# Patient Record
Sex: Male | Born: 1960
Health system: Southern US, Community
[De-identification: ages and names within clinical notes are randomized; demographics above are authoritative.]

## PROBLEM LIST (undated history)

## (undated) DIAGNOSIS — I499 Cardiac arrhythmia, unspecified: Secondary | ICD-10-CM

## (undated) DIAGNOSIS — M1990 Unspecified osteoarthritis, unspecified site: Secondary | ICD-10-CM

## (undated) HISTORY — PX: SHOULDER SURGERY: SHX246

## (undated) HISTORY — PX: VASECTOMY: SHX75

## (undated) HISTORY — PX: OTHER SURGICAL HISTORY: SHX169

---

## 2010-12-15 ENCOUNTER — Encounter: Payer: Self-pay | Admitting: Cardiology

## 2016-09-12 DIAGNOSIS — Z0000 Encounter for general adult medical examination without abnormal findings: Secondary | ICD-10-CM | POA: Diagnosis not present

## 2016-09-12 DIAGNOSIS — Z13220 Encounter for screening for lipoid disorders: Secondary | ICD-10-CM | POA: Diagnosis not present

## 2016-09-12 DIAGNOSIS — R002 Palpitations: Secondary | ICD-10-CM | POA: Diagnosis not present

## 2017-03-26 DIAGNOSIS — L821 Other seborrheic keratosis: Secondary | ICD-10-CM | POA: Diagnosis not present

## 2017-03-26 DIAGNOSIS — D225 Melanocytic nevi of trunk: Secondary | ICD-10-CM | POA: Diagnosis not present

## 2017-03-26 DIAGNOSIS — L814 Other melanin hyperpigmentation: Secondary | ICD-10-CM | POA: Diagnosis not present

## 2017-04-09 DIAGNOSIS — I669 Occlusion and stenosis of unspecified cerebral artery: Secondary | ICD-10-CM | POA: Diagnosis not present

## 2017-07-03 DIAGNOSIS — J018 Other acute sinusitis: Secondary | ICD-10-CM | POA: Diagnosis not present

## 2017-07-03 DIAGNOSIS — R05 Cough: Secondary | ICD-10-CM | POA: Diagnosis not present

## 2017-09-15 DIAGNOSIS — Z13220 Encounter for screening for lipoid disorders: Secondary | ICD-10-CM | POA: Diagnosis not present

## 2017-09-15 DIAGNOSIS — Z0000 Encounter for general adult medical examination without abnormal findings: Secondary | ICD-10-CM | POA: Diagnosis not present

## 2017-09-15 DIAGNOSIS — Z23 Encounter for immunization: Secondary | ICD-10-CM | POA: Diagnosis not present

## 2018-04-06 DIAGNOSIS — L578 Other skin changes due to chronic exposure to nonionizing radiation: Secondary | ICD-10-CM | POA: Diagnosis not present

## 2018-04-06 DIAGNOSIS — L821 Other seborrheic keratosis: Secondary | ICD-10-CM | POA: Diagnosis not present

## 2018-04-06 DIAGNOSIS — L218 Other seborrheic dermatitis: Secondary | ICD-10-CM | POA: Diagnosis not present

## 2018-05-12 DIAGNOSIS — K4020 Bilateral inguinal hernia, without obstruction or gangrene, not specified as recurrent: Secondary | ICD-10-CM | POA: Diagnosis not present

## 2018-05-12 DIAGNOSIS — Z23 Encounter for immunization: Secondary | ICD-10-CM | POA: Diagnosis not present

## 2018-05-13 ENCOUNTER — Other Ambulatory Visit: Payer: Self-pay | Admitting: Family Medicine

## 2018-05-13 ENCOUNTER — Other Ambulatory Visit: Payer: Self-pay

## 2018-05-13 DIAGNOSIS — K4020 Bilateral inguinal hernia, without obstruction or gangrene, not specified as recurrent: Secondary | ICD-10-CM

## 2018-05-14 ENCOUNTER — Ambulatory Visit
Admission: RE | Admit: 2018-05-14 | Discharge: 2018-05-14 | Disposition: A | Discharge status: Home / Self Care | Payer: 59 | Source: Ambulatory Visit | Attending: Family Medicine | Admitting: Family Medicine

## 2018-05-14 DIAGNOSIS — K4020 Bilateral inguinal hernia, without obstruction or gangrene, not specified as recurrent: Secondary | ICD-10-CM

## 2018-05-15 ENCOUNTER — Ambulatory Visit: Payer: Self-pay | Admitting: General Surgery

## 2018-05-19 NOTE — Patient Instructions (Addendum)
Andrew Matthews  05/19/2018   Your procedure is scheduled on: 06-02-18  Report to [facility] Main  Entrance  Report to admitting at      0730 AM    Call this number if you have problems the morning of surgery [phone]   Remember: Do not eat food:After Midnight.  NO SOLID FOOD AFTER MIDNIGHT THE NIGHT PRIOR TO SURGERY. NOTHING BY MOUTH EXCEPT CLEAR LIQUIDS UNTIL 3 HOURS PRIOR TO   [place] SURGERY. PLEASE FINISH ENSURE DRINK PER SURGEON ORDER 3 HOURS PRIOR TO SCHEDULED SURGERY TIME WHICH   NEEDS TO BE COMPLETED AT _0630 am ___________. Nothing by mouth after 0630 am    CLEAR LIQUID DIET   Foods Allowed                                                                     Foods Excluded  Coffee and tea, regular and decaf                             liquids that you cannot  Plain Jell-O in any flavor                                             see through such as: Fruit ices (not with fruit pulp)                                     milk, soups, orange juice  Iced Popsicles                                    All solid food Carbonated beverages, regular and diet                                    Cranberry, grape and apple juices Sports drinks like Gatorade Lightly seasoned clear broth or consume(fat free) Sugar, honey syrup   _____________________________________________________________________   BRUSH YOUR TEETH MORNING OF SURGERY AND RINSE YOUR MOUTH OUT, NO CHEWING GUM CANDY OR MINTS.     Take these medicines the morning of surgery with A SIP OF WATER: none                                You may not have any metal on your body including hair pins and              piercings  Do not wear jewelry, , lotions, powders or perfumes, deodorant                    Men may shave face and neck.   Do not bring valuables to the hospital. [facility] IS NOT  RESPONSIBLE   FOR VALUABLES.  Contacts, dentures or bridgework may not be worn into  surgery.  Leave suitcase in the car. After surgery it may be brought to your room.     Patients discharged the day of surgery will not be allowed to drive home.  Name and phone number of your driver:  Special Instructions: N/A              Please read over the following fact sheets you were given: _____________________________________________________________________             [facility] - Preparing for Surgery Before surgery, you can play an important role.  Because skin is not sterile, your skin needs to be as free of germs as possible.  You can reduce the number of germs on your skin by washing with CHG (chlorahexidine gluconate) soap before surgery.  CHG is an antiseptic cleaner which kills germs and bonds with the skin to continue killing germs even after washing. Please DO NOT use if you have an allergy to CHG or antibacterial soaps.  If your skin becomes reddened/irritated stop using the CHG and inform your nurse when you arrive at Short Stay. Do not shave (including legs and underarms) for at least 48 hours prior to the first CHG shower.  You may shave your face/neck. Please follow these instructions carefully:  1.  Shower with CHG Soap the night before surgery and the  morning of Surgery.  2.  If you choose to wash your hair, wash your hair first as usual with your  normal  shampoo.  3.  After you shampoo, rinse your hair and body thoroughly to remove the  shampoo.                           4.  Use CHG as you would any other liquid soap.  You can apply chg directly  to the skin and wash                       Gently with a scrungie or clean washcloth.  5.  Apply the CHG Soap to your body ONLY FROM THE NECK DOWN.   Do not use on face/ open                           Wound or open sores. Avoid contact with eyes, ears mouth and genitals (private parts).                       Wash face,  Genitals (private parts) with your normal soap.             6.  Wash thoroughly, paying special  attention to the area where your surgery  will be performed.  7.  Thoroughly rinse your body with warm water from the neck down.  8.  DO NOT shower/wash with your normal soap after using and rinsing off  the CHG Soap.                9.  Pat yourself dry with a clean towel.            10.  Wear clean pajamas.            11.  Place clean sheets on your bed the night of your first shower and do not  sleep with pets. Day of Surgery :  Do not apply any lotions/deodorants the morning of surgery.  Please wear clean clothes to the hospital/surgery center.  FAILURE TO FOLLOW THESE INSTRUCTIONS MAY RESULT IN THE CANCELLATION OF YOUR SURGERY PATIENT SIGNATURE_________________________________  NURSE SIGNATURE__________________________________  ________________________________________________________________________

## 2018-05-20 ENCOUNTER — Encounter (HOSPITAL_COMMUNITY): Payer: Self-pay

## 2018-05-20 ENCOUNTER — Encounter (HOSPITAL_COMMUNITY)
Admission: RE | Admit: 2018-05-20 | Discharge: 2018-05-20 | Disposition: A | Discharge status: Home / Self Care | Payer: 59 | Source: Ambulatory Visit | Attending: General Surgery | Admitting: General Surgery

## 2018-05-20 ENCOUNTER — Other Ambulatory Visit: Payer: Self-pay

## 2018-05-20 DIAGNOSIS — R001 Bradycardia, unspecified: Secondary | ICD-10-CM | POA: Diagnosis not present

## 2018-05-20 DIAGNOSIS — Z01818 Encounter for other preprocedural examination: Secondary | ICD-10-CM | POA: Insufficient documentation

## 2018-05-20 DIAGNOSIS — K4020 Bilateral inguinal hernia, without obstruction or gangrene, not specified as recurrent: Secondary | ICD-10-CM | POA: Diagnosis not present

## 2018-05-20 HISTORY — DX: Cardiac arrhythmia, unspecified: I49.9

## 2018-05-20 HISTORY — DX: Unspecified osteoarthritis, unspecified site: M19.90

## 2018-05-20 LAB — CBC
HEMATOCRIT: 45.6 % (ref 39.0–52.0)
HEMOGLOBIN: 15.8 g/dL (ref 13.0–17.0)
MCH: 32.8 pg (ref 26.0–34.0)
MCHC: 34.6 g/dL (ref 30.0–36.0)
MCV: 94.6 fL (ref 78.0–100.0)
Platelets: 165 10*3/uL (ref 150–400)
RBC: 4.82 MIL/uL (ref 4.22–5.81)
RDW: 12.1 % (ref 11.5–15.5)
WBC: 3.3 10*3/uL — AB (ref 4.0–10.5)

## 2018-06-02 ENCOUNTER — Other Ambulatory Visit: Payer: Self-pay

## 2018-06-02 ENCOUNTER — Ambulatory Visit (HOSPITAL_COMMUNITY): Payer: 59 | Admitting: Anesthesiology

## 2018-06-02 ENCOUNTER — Encounter (HOSPITAL_COMMUNITY)
Admission: RE | Disposition: A | Discharge status: Home Health | Payer: Self-pay | Source: Ambulatory Visit | Attending: General Surgery

## 2018-06-02 ENCOUNTER — Ambulatory Visit (HOSPITAL_COMMUNITY)
Admission: RE | Admit: 2018-06-02 | Discharge: 2018-06-02 | Disposition: A | Discharge status: Home Health | Payer: 59 | Source: Ambulatory Visit | Attending: General Surgery | Admitting: General Surgery

## 2018-06-02 ENCOUNTER — Encounter (HOSPITAL_COMMUNITY): Payer: Self-pay | Admitting: *Deleted

## 2018-06-02 DIAGNOSIS — K4020 Bilateral inguinal hernia, without obstruction or gangrene, not specified as recurrent: Secondary | ICD-10-CM | POA: Diagnosis not present

## 2018-06-02 DIAGNOSIS — Z791 Long term (current) use of non-steroidal anti-inflammatories (NSAID): Secondary | ICD-10-CM | POA: Insufficient documentation

## 2018-06-02 DIAGNOSIS — Z9852 Vasectomy status: Secondary | ICD-10-CM | POA: Insufficient documentation

## 2018-06-02 DIAGNOSIS — Z880 Allergy status to penicillin: Secondary | ICD-10-CM | POA: Insufficient documentation

## 2018-06-02 DIAGNOSIS — M479 Spondylosis, unspecified: Secondary | ICD-10-CM | POA: Insufficient documentation

## 2018-06-02 DIAGNOSIS — D176 Benign lipomatous neoplasm of spermatic cord: Secondary | ICD-10-CM | POA: Diagnosis not present

## 2018-06-02 HISTORY — PX: INSERTION OF MESH: SHX5868

## 2018-06-02 HISTORY — PX: INGUINAL HERNIA REPAIR: SHX194

## 2018-06-02 SURGERY — REPAIR, HERNIA, INGUINAL, BILATERAL, LAPAROSCOPIC
Anesthesia: General | Laterality: Bilateral

## 2018-06-02 MED ORDER — GABAPENTIN 300 MG PO CAPS
300.0000 mg | ORAL_CAPSULE | ORAL | Status: AC
  Administered 2018-06-02: 300 mg via ORAL
  Filled 2018-06-02: qty 1

## 2018-06-02 MED ORDER — PROPOFOL 10 MG/ML IV BOLUS
INTRAVENOUS | Status: AC
  Filled 2018-06-02: qty 20

## 2018-06-02 MED ORDER — FENTANYL CITRATE (PF) 100 MCG/2ML IJ SOLN: 25.0000 ug | INTRAMUSCULAR | Status: DC | PRN

## 2018-06-02 MED ORDER — SUGAMMADEX SODIUM 200 MG/2ML IV SOLN
INTRAVENOUS | Status: DC | PRN
  Administered 2018-06-02: 200 mg via INTRAVENOUS

## 2018-06-02 MED ORDER — CEFAZOLIN SODIUM-DEXTROSE 2-4 GM/100ML-% IV SOLN
2.0000 g | INTRAVENOUS | Status: AC
  Administered 2018-06-02: 2 g via INTRAVENOUS
  Filled 2018-06-02: qty 100

## 2018-06-02 MED ORDER — DEXAMETHASONE SODIUM PHOSPHATE 10 MG/ML IJ SOLN
INTRAMUSCULAR | Status: DC | PRN
  Administered 2018-06-02: 10 mg via INTRAVENOUS

## 2018-06-02 MED ORDER — ROCURONIUM BROMIDE 10 MG/ML (PF) SYRINGE
PREFILLED_SYRINGE | INTRAVENOUS | Status: DC | PRN
  Administered 2018-06-02: 20 mg via INTRAVENOUS
  Administered 2018-06-02 (×3): 10 mg via INTRAVENOUS
  Administered 2018-06-02: 40 mg via INTRAVENOUS

## 2018-06-02 MED ORDER — MIDAZOLAM HCL 2 MG/2ML IJ SOLN
INTRAMUSCULAR | Status: AC
  Filled 2018-06-02: qty 2

## 2018-06-02 MED ORDER — ONDANSETRON HCL 4 MG/2ML IJ SOLN
INTRAMUSCULAR | Status: DC | PRN
  Administered 2018-06-02: 4 mg via INTRAVENOUS

## 2018-06-02 MED ORDER — IBUPROFEN 800 MG PO TABS: 800.0000 mg | ORAL_TABLET | Freq: Three times a day (TID) | ORAL | 0 refills | Status: DC | PRN

## 2018-06-02 MED ORDER — ACETAMINOPHEN 500 MG PO TABS
1000.0000 mg | ORAL_TABLET | ORAL | Status: AC
  Administered 2018-06-02: 1000 mg via ORAL
  Filled 2018-06-02: qty 2

## 2018-06-02 MED ORDER — ENSURE PRE-SURGERY PO LIQD: 296.0000 mL | Freq: Once | ORAL | Status: DC

## 2018-06-02 MED ORDER — LACTATED RINGERS IV SOLN
INTRAVENOUS | Status: DC
  Administered 2018-06-02 (×2): via INTRAVENOUS

## 2018-06-02 MED ORDER — ONDANSETRON HCL 4 MG/2ML IJ SOLN
INTRAMUSCULAR | Status: AC
  Filled 2018-06-02: qty 2

## 2018-06-02 MED ORDER — 0.9 % SODIUM CHLORIDE (POUR BTL) OPTIME
TOPICAL | Status: DC | PRN
  Administered 2018-06-02: 1000 mL

## 2018-06-02 MED ORDER — LIDOCAINE 2% (20 MG/ML) 5 ML SYRINGE
INTRAMUSCULAR | Status: AC
  Filled 2018-06-02: qty 5

## 2018-06-02 MED ORDER — BUPIVACAINE LIPOSOME 1.3 % IJ SUSP
20.0000 mL | Freq: Once | INTRAMUSCULAR | Status: AC
  Administered 2018-06-02: 20 mL
  Filled 2018-06-02: qty 20

## 2018-06-02 MED ORDER — BUPIVACAINE HCL (PF) 0.25 % IJ SOLN
INTRAMUSCULAR | Status: AC
  Filled 2018-06-02: qty 30

## 2018-06-02 MED ORDER — FENTANYL CITRATE (PF) 100 MCG/2ML IJ SOLN
INTRAMUSCULAR | Status: DC | PRN
  Administered 2018-06-02: 50 ug via INTRAVENOUS
  Administered 2018-06-02: 100 ug via INTRAVENOUS

## 2018-06-02 MED ORDER — LIDOCAINE 2% (20 MG/ML) 5 ML SYRINGE
INTRAMUSCULAR | Status: DC | PRN
  Administered 2018-06-02 (×2): 1.5 mg/kg/h via INTRAVENOUS

## 2018-06-02 MED ORDER — CELECOXIB 200 MG PO CAPS
400.0000 mg | ORAL_CAPSULE | ORAL | Status: AC
  Administered 2018-06-02: 400 mg via ORAL
  Filled 2018-06-02: qty 2

## 2018-06-02 MED ORDER — HYDROCODONE-ACETAMINOPHEN 5-325 MG PO TABS: 1.0000 | ORAL_TABLET | Freq: Four times a day (QID) | ORAL | 0 refills | Status: DC | PRN

## 2018-06-02 MED ORDER — CHLORHEXIDINE GLUCONATE CLOTH 2 % EX PADS: 6.0000 | MEDICATED_PAD | Freq: Once | CUTANEOUS | Status: DC

## 2018-06-02 MED ORDER — BUPIVACAINE HCL (PF) 0.25 % IJ SOLN
INTRAMUSCULAR | Status: DC | PRN
  Administered 2018-06-02: 30 mL

## 2018-06-02 MED ORDER — OXYCODONE HCL 5 MG PO TABS
5.0000 mg | ORAL_TABLET | Freq: Once | ORAL | Status: AC | PRN
  Administered 2018-06-02: 5 mg via ORAL

## 2018-06-02 MED ORDER — MIDAZOLAM HCL 5 MG/5ML IJ SOLN
INTRAMUSCULAR | Status: DC | PRN
  Administered 2018-06-02: 2 mg via INTRAVENOUS

## 2018-06-02 MED ORDER — PROPOFOL 10 MG/ML IV BOLUS
INTRAVENOUS | Status: DC | PRN
  Administered 2018-06-02: 150 mg via INTRAVENOUS

## 2018-06-02 MED ORDER — OXYCODONE HCL 5 MG PO TABS
ORAL_TABLET | ORAL | Status: AC
  Filled 2018-06-02: qty 1

## 2018-06-02 MED ORDER — ONDANSETRON HCL 4 MG/2ML IJ SOLN: 4.0000 mg | Freq: Once | INTRAMUSCULAR | Status: DC | PRN

## 2018-06-02 MED ORDER — OXYCODONE HCL 5 MG/5ML PO SOLN
5.0000 mg | Freq: Once | ORAL | Status: AC | PRN
  Filled 2018-06-02: qty 5

## 2018-06-02 MED ORDER — FENTANYL CITRATE (PF) 250 MCG/5ML IJ SOLN
INTRAMUSCULAR | Status: AC
  Filled 2018-06-02: qty 5

## 2018-06-02 MED ORDER — SUGAMMADEX SODIUM 200 MG/2ML IV SOLN
INTRAVENOUS | Status: AC
  Filled 2018-06-02: qty 2

## 2018-06-02 SURGICAL SUPPLY — 35 items
APPLIER CLIP 5 13 M/L LIGAMAX5 (MISCELLANEOUS)
BANDAGE ADH SHEER 1  50/CT (GAUZE/BANDAGES/DRESSINGS) ×3 IMPLANT
BENZOIN TINCTURE PRP APPL 2/3 (GAUZE/BANDAGES/DRESSINGS) ×3 IMPLANT
CABLE HIGH FREQUENCY MONO STRZ (ELECTRODE) ×3 IMPLANT
CHLORAPREP W/TINT 26ML (MISCELLANEOUS) ×3 IMPLANT
CLIP APPLIE 5 13 M/L LIGAMAX5 (MISCELLANEOUS) IMPLANT
CLOSURE WOUND 1/2 X4 (GAUZE/BANDAGES/DRESSINGS) ×1
COVER SURGICAL LIGHT HANDLE (MISCELLANEOUS) ×3 IMPLANT
COVER WAND RF STERILE (DRAPES) IMPLANT
DECANTER SPIKE VIAL GLASS SM (MISCELLANEOUS) ×3 IMPLANT
DEVICE SECURE STRAP 25 ABSORB (INSTRUMENTS) ×3 IMPLANT
DRAIN CHANNEL 19F RND (DRAIN) IMPLANT
ELECT REM PT RETURN 15FT ADLT (MISCELLANEOUS) ×3 IMPLANT
EVACUATOR SILICONE 100CC (DRAIN) IMPLANT
GLOVE BIO SURGEON STRL SZ7 (GLOVE) ×3 IMPLANT
GLOVE BIOGEL PI IND STRL 7.0 (GLOVE) ×1 IMPLANT
GLOVE BIOGEL PI INDICATOR 7.0 (GLOVE) ×2
GLOVE SURG SS PI 7.0 STRL IVOR (GLOVE) ×3 IMPLANT
GOWN STRL REUS W/TWL LRG LVL3 (GOWN DISPOSABLE) ×3 IMPLANT
GOWN STRL REUS W/TWL XL LVL3 (GOWN DISPOSABLE) ×6 IMPLANT
KIT BASIN OR (CUSTOM PROCEDURE TRAY) ×3 IMPLANT
MESH 3DMAX 4X6 LT LRG (Mesh General) ×3 IMPLANT
MESH 3DMAX 5X7 RT XLRG (Mesh General) ×3 IMPLANT
SCISSORS LAP 5X35 DISP (ENDOMECHANICALS) IMPLANT
SET IRRIG TUBING LAPAROSCOPIC (IRRIGATION / IRRIGATOR) ×3 IMPLANT
STRIP CLOSURE SKIN 1/2X4 (GAUZE/BANDAGES/DRESSINGS) ×2 IMPLANT
SUT ETHILON 2 0 PS N (SUTURE) IMPLANT
SUT MNCRL AB 4-0 PS2 18 (SUTURE) ×3 IMPLANT
SUT VICRYL 0 UR6 27IN ABS (SUTURE) IMPLANT
TOWEL OR 17X26 10 PK STRL BLUE (TOWEL DISPOSABLE) ×3 IMPLANT
TRAY FOLEY MTR SLVR 16FR STAT (SET/KITS/TRAYS/PACK) IMPLANT
TRAY LAPAROSCOPIC (CUSTOM PROCEDURE TRAY) ×3 IMPLANT
TROCAR CANNULA W/PORT DUAL 5MM (MISCELLANEOUS) ×3 IMPLANT
TROCAR XCEL 12X100 BLDLESS (ENDOMECHANICALS) ×3 IMPLANT
TUBING INSUF HEATED (TUBING) ×3 IMPLANT

## 2018-06-02 NOTE — Op Note (Signed)
Preop diagnosis: bilateral inguinal hernia  Postop diagnosis: bilateral inguinal hernia  Procedure: laparoscopic Bilateral inguinal hernia repair with mesh  Surgeon: Gurney Maxin, M.D.  Asst: none  Anesthesia: Gen.   Indications for procedure: Andrew Matthews is a 57 y.o. male with symptoms of pain and enlarging Bilateral inguinal hernia(s). After discussing risks, alternatives and benefits he decided on laparoscopic repair and was brought to day surgery for repair.  Description of procedure: The patient was brought into the operative suite, placed supine. Anesthesia was administered with endotracheal tube. Patient was strapped in place. The patient was prepped and draped in the usual sterile fashion.  Next Exparel:Marcaine mix was injected to the left of the umbilicus, and a transverse 2 cm incision was made. Dissection was used to visualize the anterior rectus sheath. Anterior rectus sheath was sharply incised, next to the 12 mm trocar was inserted into the preperitoneal space. Laparoscope was inserted to confirm placement and then used to bluntly dissect the retrorectus space clear. Once the midline ws free 2 65mm trocars were placed. 1 in the suprapubic area and 1in 5cm superior to the first.  On initial visualization , There appeared to be a small direct right inguinal hernia. Next a began our dissection identifying the ASIS laterally and then working back medially removing the filmy tissue and adhesions of the peritoneum to the abdominal wall. Hernia sac was completely dissected out of the canal. Vas deference and contents of the cord were safely dissected away of the hernia sac. The Exparel mix was infused along the transversus abdominis planes laterally and near the lacunar ligament medially. There was a lipoma along the cord that was reduced from the inguinal canal.  Next, attention was turned to the left side. There was a small left direct inguinal hernia that was reduced. Next a began  our dissection identifying the ASIS laterally and then working back medially removing the filmy tissue and adhesions of the peritoneum to the abdominal wall. Hernia sac was completely dissected out of the canal. Vas deference and contents of the cord were safely dissected away of the hernia sac. The Exparel mix was infused along the transversus abdominis planes laterally and near the lacunar ligament medially. There was a lipoma along the cord that was reduced from the inguinal canal. There was one area of bleeding on the abdominal wall lateral to the deep inguinal ring, hemostasis was applied with cautery.  A left large 3D max mesh was inserted and tacked medially to the lacunar ligament. There were 2 additional tacks used in the anterior space. The mesh was positioned flat and directly up against the direct and indirect areas. A right extra large 3D max mesh was inserted and tacked medially to the lacunar ligament. There were 2 additional tacks used in the anterior space. The mesh was positioned flat and directly up against the direct and indirect areas  The CO2 was evacuated while watching to ensure the mesh did not migrate.. The anterior rectus fascia was closed with 0 vicryl in interrupted sutures and all skin incisions were closed with 4-0 monocryl subcu stitch. The patient awoke from anesthesia and was brought to PACU in stable condition.  Findings: bilateral direct inguinal hernias, bilateral lipoma of the cord  Specimen: none  Blood loss: 30 ml  Local anesthesia: 50 ml Exparel:Marcaine mix  Complications: none  Implant: large left Bard 3D max mesh, extra large right Bard 3D max mesh  Images:    Gurney Maxin, M.D. General, Bariatric, & Minimally Invasive  [facility], [place] 10:55 AM 06/02/2018

## 2018-06-02 NOTE — Anesthesia Preprocedure Evaluation (Addendum)
Anesthesia Evaluation  Patient identified by MRN, date of birth, ID band Patient awake    Reviewed: Allergy & Precautions, NPO status , Patient's Chart, lab work & pertinent test results  History of Anesthesia Complications Negative for: history of anesthetic complications  Airway Mallampati: I  TM Distance: <3 FB Neck ROM: Full    Dental no notable dental hx. (+) Teeth Intact, Dental Advisory Given   Pulmonary neg pulmonary ROS,    Pulmonary exam normal        Cardiovascular Exercise Tolerance: Good Normal cardiovascular exam+ dysrhythmias (SVT associated with exercise; no meds)      Neuro/Psych negative neurological ROS  negative psych ROS   GI/Hepatic negative GI ROS, Neg liver ROS,   Endo/Other  negative endocrine ROS  Renal/GU negative Renal ROS  negative genitourinary   Musculoskeletal  (+) Arthritis , Osteoarthritis,    Abdominal   Peds  Hematology negative hematology ROS (+)   Anesthesia Other Findings   Reproductive/Obstetrics                            Anesthesia Physical Anesthesia Plan  ASA: II  Anesthesia Plan: General   Post-op Pain Management:    Induction: Intravenous  PONV Risk Score and Plan: 2 and Ondansetron, Dexamethasone and Treatment may vary due to age or medical condition  Airway Management Planned: Oral ETT  Additional Equipment: None  Intra-op Plan:   Post-operative Plan: Extubation in OR  Informed Consent: I have reviewed the patients History and Physical, chart, labs and discussed the procedure including the risks, benefits and alternatives for the proposed anesthesia with the patient or authorized representative who has indicated his/her understanding and acceptance.     Plan Discussed with:   Anesthesia Plan Comments:        Anesthesia Quick Evaluation

## 2018-06-02 NOTE — Transfer of Care (Signed)
Immediate Anesthesia Transfer of Care Note  Patient: Andrew Matthews  Procedure(s) Performed: LAPAROSCOPIC BILATERAL INGUINAL HERNIA REPAIR (Bilateral ) INSERTION OF MESH (Bilateral )  Patient Location: PACU  Anesthesia Type:General  Level of Consciousness: awake, alert  and oriented  Airway & Oxygen Therapy: Patient Spontanous Breathing and Patient connected to face mask oxygen  Post-op Assessment: Report given to RN and Post -op Vital signs reviewed and stable  Post vital signs: Reviewed and stable  Last Vitals:  Vitals Value Taken Time  BP 136/88 06/02/2018 11:03 AM  Temp    Pulse 70 06/02/2018 11:05 AM  Resp 17 06/02/2018 11:05 AM  SpO2 98 % 06/02/2018 11:05 AM  Vitals shown include unvalidated device data.  Last Pain:  Vitals:   06/02/18 0744  TempSrc: Oral      Patients Stated Pain Goal: 3 (47/15/95 [id number])  Complications: No apparent anesthesia complications

## 2018-06-02 NOTE — Anesthesia Postprocedure Evaluation (Signed)
Anesthesia Post Note  Patient: Andrew Matthews  Procedure(s) Performed: LAPAROSCOPIC BILATERAL INGUINAL HERNIA REPAIR (Bilateral ) INSERTION OF MESH (Bilateral )     Patient location during evaluation: PACU Anesthesia Type: General Level of consciousness: awake and alert Pain management: pain level controlled Vital Signs Assessment: post-procedure vital signs reviewed and stable Respiratory status: spontaneous breathing, nonlabored ventilation and respiratory function stable Cardiovascular status: blood pressure returned to baseline and stable Postop Assessment: no apparent nausea or vomiting Anesthetic complications: no    Last Vitals:  Vitals:   06/02/18 1115 06/02/18 1130  BP: 140/90 (!) 135/93  Pulse: (!) 59 (!) 57  Resp: 16 13  Temp:    SpO2: 100% 98%    Last Pain:  Vitals:   06/02/18 1130  TempSrc:   PainSc: 0-No pain                 Lidia Collum

## 2018-06-02 NOTE — H&P (Signed)
Andrew Matthews is an 57 y.o. male.   Chief Complaint: abdominal pain HPI: 57 yo male with lower abdominal pain found to have bilateral hernias.  Past Medical History:  Diagnosis Date  . Arthritis    osteoarthritis lower back  . Dysrhythmia    Arrythmias  with workouts See's Dr. Wynonia Lawman    Past Surgical History:  Procedure Laterality Date  . hemmhroiectomy    . SHOULDER SURGERY     manipulation under anesthesia  . VASECTOMY      History reviewed. No pertinent family history. Social History:  reports that Andrew Matthews has never smoked. Andrew Matthews has never used smokeless tobacco. Andrew Matthews reports that Andrew Matthews drinks about 1.0 standard drinks of alcohol per week. Andrew Matthews reports that Andrew Matthews does not use drugs.  Allergies:  Allergies  Allergen Reactions  . Penicillins Other (See Comments)    Makes blood pressure drop    Medications Prior to Admission  Medication Sig Dispense Refill  . meloxicam (MOBIC) 7.5 MG tablet Take 7.5 mg by mouth daily as needed for pain.  2  . naproxen sodium (ALEVE) 220 MG tablet Take 220 mg by mouth daily as needed (pain).      No results found for this or any previous visit (from the past 48 hour(s)). No results found.  Review of Systems  Constitutional: Negative for chills and fever.  HENT: Negative for hearing loss.   Eyes: Negative for blurred vision and double vision.  Respiratory: Negative for cough and hemoptysis.   Cardiovascular: Negative for chest pain and palpitations.  Gastrointestinal: Positive for abdominal pain. Negative for nausea and vomiting.  Genitourinary: Negative for dysuria and urgency.  Musculoskeletal: Negative for myalgias and neck pain.  Skin: Negative for itching and rash.  Neurological: Negative for dizziness, tingling and headaches.  Endo/Heme/Allergies: Does not bruise/bleed easily.  Psychiatric/Behavioral: Negative for depression and suicidal ideas.    Blood pressure 126/79, pulse (!) 52, temperature 97.8 F (36.6 C), temperature source Oral,  resp. rate 16, height 6' (1.829 m), weight 83.9 kg, SpO2 100 %. Physical Exam  Vitals reviewed. Constitutional: Andrew Matthews is oriented to person, place, and time. Andrew Matthews appears well-developed and well-nourished.  HENT:  Head: Normocephalic and atraumatic.  Eyes: Pupils are equal, round, and reactive to light. Conjunctivae and EOM are normal.  Neck: Normal range of motion. Neck supple.  Cardiovascular: Normal rate and regular rhythm.  Respiratory: Effort normal and breath sounds normal.  GI: Soft. Bowel sounds are normal. Andrew Matthews exhibits no distension. There is no tenderness.  Bilateral inguinal hernias  Musculoskeletal: Normal range of motion.  Neurological: Andrew Matthews is alert and oriented to person, place, and time.  Skin: Skin is warm and dry.  Psychiatric: Andrew Matthews has a normal mood and affect. His behavior is normal.     Assessment/Plan 57 yo male with bilateral inguinal hernias -lap bilateral inguinal hernia repair with mesh -planned outpatient procedure  Mickeal Skinner, MD 06/02/2018, 9:17 AM

## 2018-06-02 NOTE — Anesthesia Procedure Notes (Signed)
Procedure Name: Intubation Date/Time: 06/02/2018 9:40 AM Performed by: Glory Buff, CRNA Pre-anesthesia Checklist: Patient identified, Emergency Drugs available, Suction available and Patient being monitored Patient Re-evaluated:Patient Re-evaluated prior to induction Oxygen Delivery Method: Circle system utilized Preoxygenation: Pre-oxygenation with 100% oxygen Induction Type: IV induction Ventilation: Mask ventilation without difficulty Laryngoscope Size: Miller and 3 Grade View: Grade I Tube type: Oral Tube size: 7.5 mm Number of attempts: 1 Airway Equipment and Method: Stylet and Oral airway Placement Confirmation: ETT inserted through vocal cords under direct vision,  positive ETCO2 and breath sounds checked- equal and bilateral Secured at: 22 cm Tube secured with: Tape Dental Injury: Teeth and Oropharynx as per pre-operative assessment

## 2018-06-03 ENCOUNTER — Encounter (HOSPITAL_COMMUNITY): Payer: Self-pay | Admitting: General Surgery

## 2018-09-22 DIAGNOSIS — L820 Inflamed seborrheic keratosis: Secondary | ICD-10-CM | POA: Diagnosis not present

## 2018-09-29 DIAGNOSIS — Z136 Encounter for screening for cardiovascular disorders: Secondary | ICD-10-CM | POA: Diagnosis not present

## 2018-09-29 DIAGNOSIS — Z5181 Encounter for therapeutic drug level monitoring: Secondary | ICD-10-CM | POA: Diagnosis not present

## 2018-09-29 DIAGNOSIS — R7309 Other abnormal glucose: Secondary | ICD-10-CM | POA: Diagnosis not present

## 2018-10-01 DIAGNOSIS — Z0000 Encounter for general adult medical examination without abnormal findings: Secondary | ICD-10-CM | POA: Diagnosis not present

## 2019-04-29 IMAGING — US US PELVIS LIMITED
1 series · 10 of 10 positions shown · non-contrast
Comparison: None.

CLINICAL DATA: Assess bilateral inguinal hernias

EXAM:
LIMITED ULTRASOUND OF PELVIS
TECHNIQUE: Limited transabdominal ultrasound examination of the pelvis was
performed.

[Series 1: us pelvis limited · 10 acquisitions, 10 frames shown]
[im 1/10]
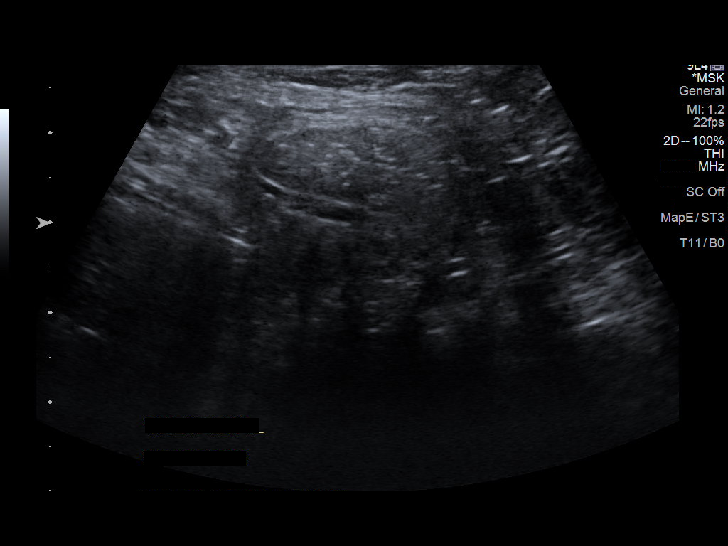
[im 2/10]
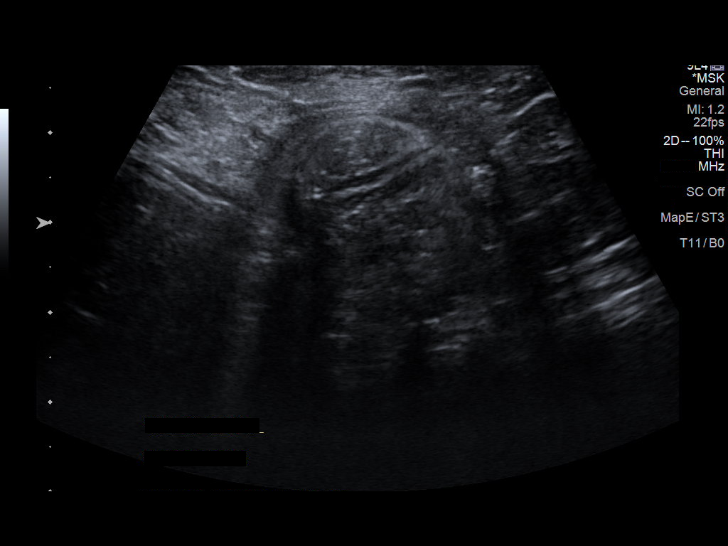
[im 3/10]
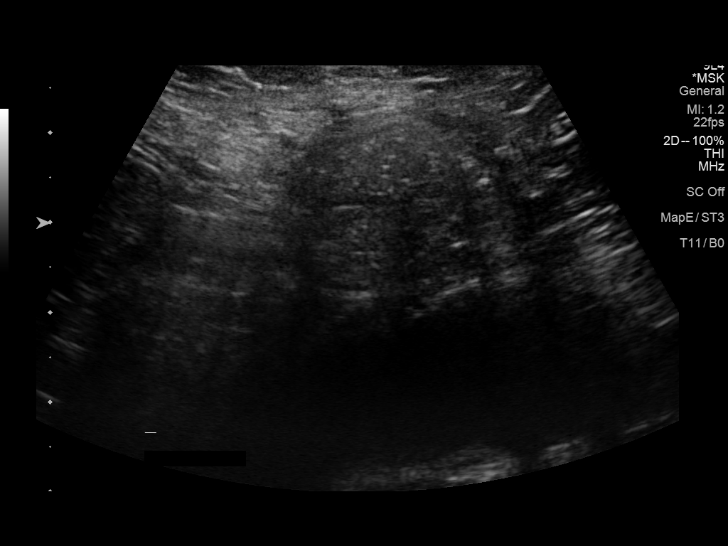
[im 4/10]
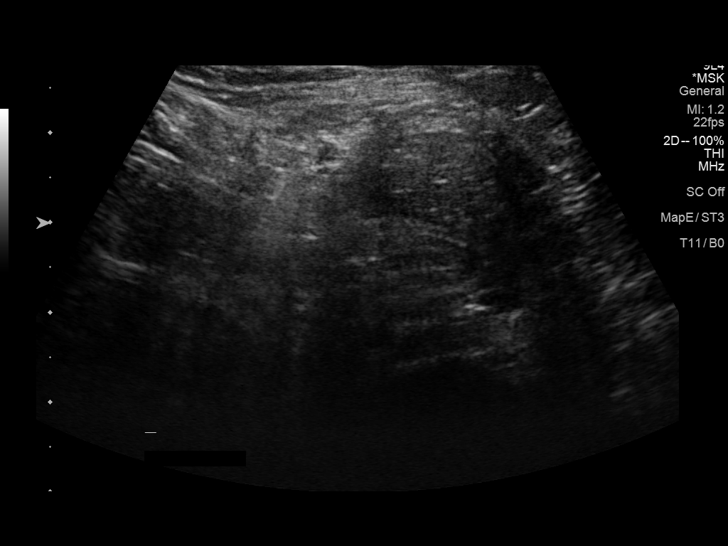
[im 5/10]
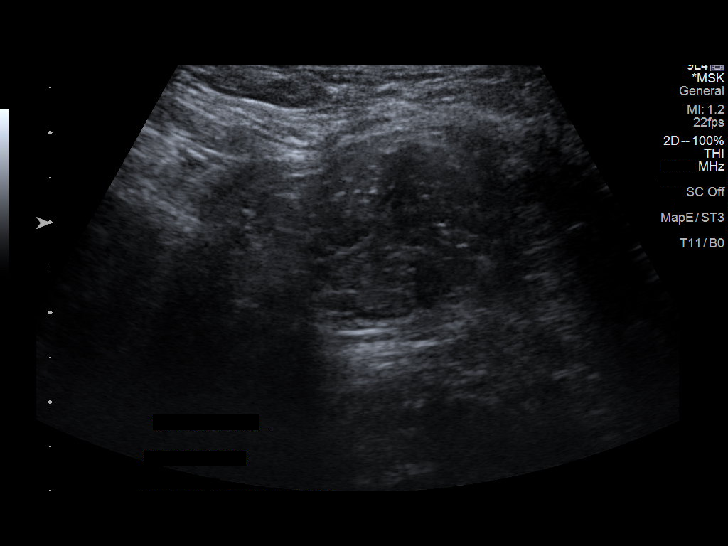
[im 6/10]
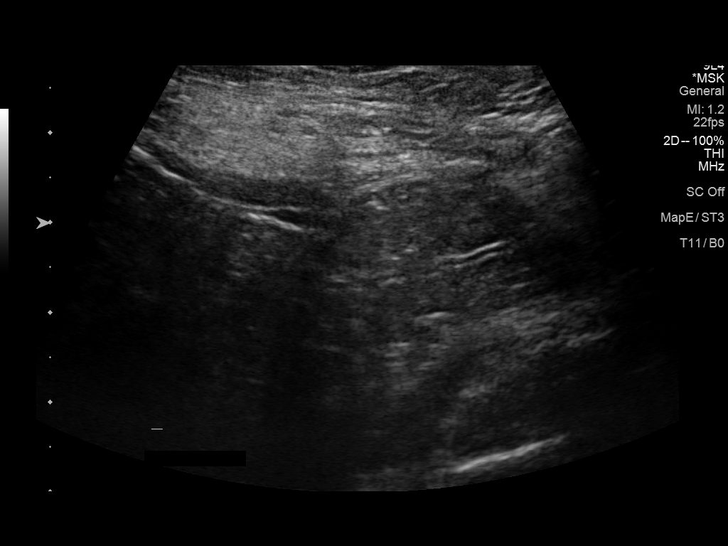
[im 7/10]
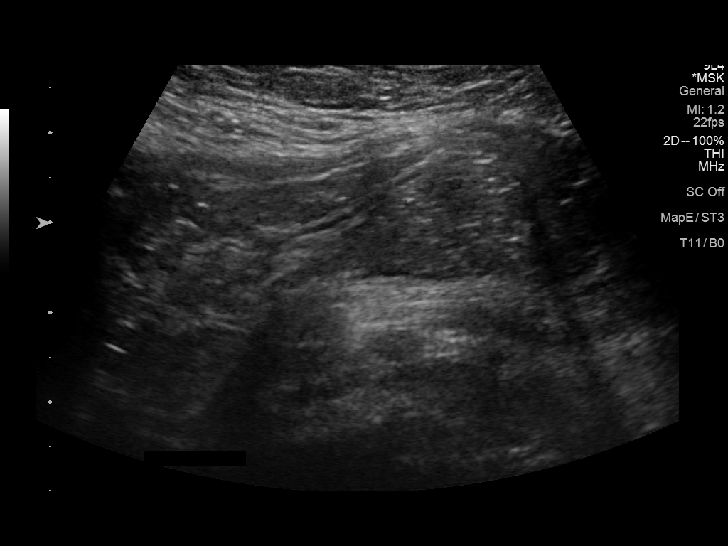
[im 8/10]
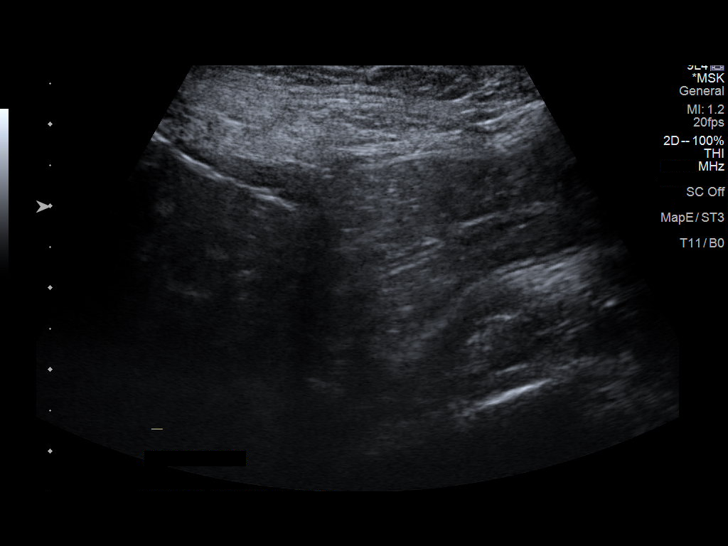
[im 9/10]
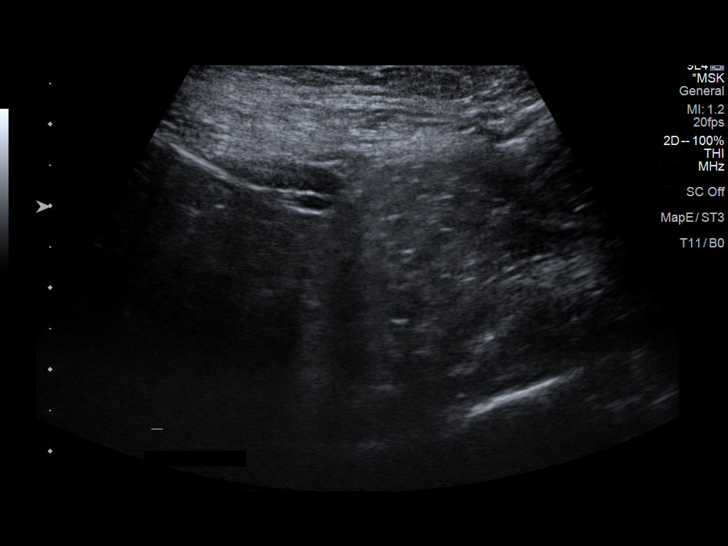
[im 10/10]
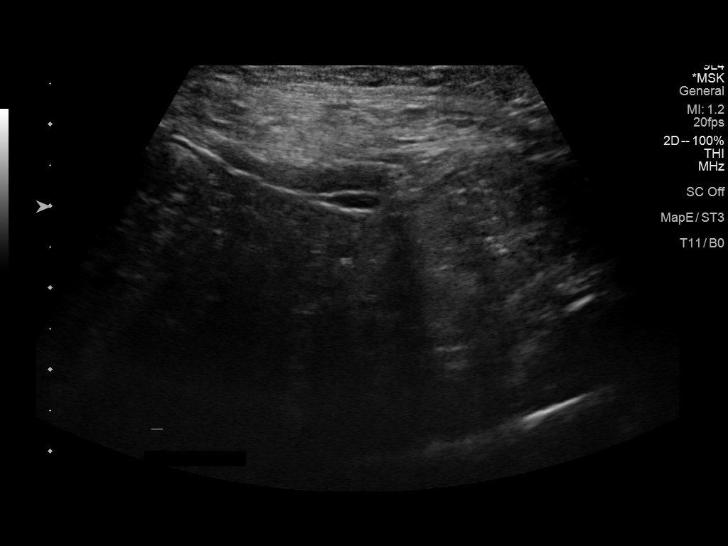

[10 of 10 positions shown; findings below may reference images not displayed]

FINDINGS: Ultrasound in the bilateral inguinal region demonstrates bilateral
inguinal hernias. Both hernias contain bowel.
IMPRESSION: Bilateral inguinal hernias containing bowel.

## 2019-09-16 ENCOUNTER — Telehealth: Payer: Self-pay | Admitting: Radiology

## 2019-09-16 ENCOUNTER — Other Ambulatory Visit: Payer: Self-pay

## 2019-09-16 ENCOUNTER — Encounter: Payer: Self-pay | Admitting: Cardiology

## 2019-09-16 ENCOUNTER — Ambulatory Visit (INDEPENDENT_AMBULATORY_CARE_PROVIDER_SITE_OTHER): Payer: 59 | Admitting: Cardiology

## 2019-09-16 VITALS — BP 131/72 | HR 48 | Temp 97.9°F | Ht 72.0 in | Wt 171.8 lb

## 2019-09-16 DIAGNOSIS — R002 Palpitations: Secondary | ICD-10-CM

## 2019-09-16 DIAGNOSIS — Z8249 Family history of ischemic heart disease and other diseases of the circulatory system: Secondary | ICD-10-CM

## 2019-09-16 DIAGNOSIS — Z7189 Other specified counseling: Secondary | ICD-10-CM | POA: Diagnosis not present

## 2019-09-16 NOTE — Progress Notes (Signed)
Cardiology Office Note:    Date:  09/16/2019   ID:  Andrew Matthews, DOB Nov 01, 1960, MRN [id number]  PCP:  Vernie Shanks, MD  Cardiologist:  Buford Dresser, MD  Referring MD: Leighton Ruff, MD   CC: new patient consultation for the evaluation of palpitations  History of Present Illness:    Andrew Matthews is a 59 y.o. male with PMH palpitations who is seen as a new consult at the request of Leighton Ruff, MD for the evaluation and management of exertional palpitations. He was last seen by Dr. Wynonia Lawman 06/29/2015, note reviewed.  Cardiac history, per notes: Palpitations Family history of heart disease Prior procedures with Dr. Wynonia Lawman: echo, cardiac CT, treadmill  Treadmill 08/18/2014: Bruce protocol, 10:52 minutes, 13 METs. HR peak 185. No ECG changes with ischemia. Rare PVCs at rest and early exercise. No exercise induced arrhythmia.  Echo from [facility] 2012: LV normal, EF 65-69%. Limited study.  Tachycardia/palpitations: Has had for a number of years, has never fully stopped. Dr. Wynonia Lawman was not concerned in the past. He is very active, cycles 3500 miles/year. Trains hard. Over the summer, noted dizzy spells occasionally as well. Not clearly linked to the same time he has palpitations.   This month, has had more frequent events. Recently he had three in one workout. Was a relatively easy workout at the time.   Has a KardiaMobile, brings a log today. These showed predominantly sinus bradycardia, sinus rhythm, and sinus tachycardia where the rhythm can be clearly seen. No high risk arrhythmia seen.  Has seen HR at high as 190s, but interestingly feels beats with lower resting heart rates--his normal resting HR is in the 40s, sometimes feels a flutter/off beat with those rate.  His dizzy spells are more vertigo--worse with changing position, turning head, standing up from chair. Feels like the room is moving. Hasn't tried any maneuvers for this, but one child is a  pediatrician, one is a med student who offered to try maneuvers. Standing up rapidly is a slightly different sensation.  In the last month, has had minor positional dizzyness, rare palpitations.  -Aggravating/alleviating factors: worse after poor sleep, extra glass of wine -Syncope: never -Caffeine: 1/2 caff in AM, decaf also -Alcohol: occasional wine, no more than 1-2 glass/night during the week, sometimes splits a bottle on Friday/Saturday night -Tobacco: never -OTC supplements: reviewed, no high risk supplements -Comorbidities: none -Exercise level: elite athlete level -Family history: father had MI, had bypass surgery, was overweight, smoker. Issues started in early 25s. Brother similar pattern, had aortic aneurysm. Older sister (22 years older) has a rhythm issue, has had an ablation. Older sister (74 years older) had an MI recently, overweight.  Past Medical History:  Diagnosis Date  . Arthritis    osteoarthritis lower back  . Dysrhythmia    Arrythmias  with workouts See's Dr. Wynonia Lawman    Past Surgical History:  Procedure Laterality Date  . hemmhroiectomy    . INGUINAL HERNIA REPAIR Bilateral 06/02/2018   Procedure: LAPAROSCOPIC BILATERAL INGUINAL HERNIA REPAIR;  Surgeon: Kieth Brightly, Arta Bruce, MD;  Location: [facility];  Service: General;  Laterality: Bilateral;  . INSERTION OF MESH Bilateral 06/02/2018   Procedure: INSERTION OF MESH;  Surgeon: Kinsinger, Arta Bruce, MD;  Location: [facility];  Service: General;  Laterality: Bilateral;  . SHOULDER SURGERY     manipulation under anesthesia  . VASECTOMY      Current Medications: Current Outpatient Medications on File Prior to Visit  Medication Sig  . doxylamine, Sleep, (SLEEP  AID) 25 MG tablet Take 25 mg by mouth at bedtime as needed. As needed for sleep  . fexofenadine-pseudoephedrine (ALLEGRA-D 24) 180-240 MG 24 hr tablet Take 1 tablet by mouth daily.  . Melatonin 1 MG CAPS Take 1 mg by mouth. 1 tablet at bedtime  . meloxicam  (MOBIC) 7.5 MG tablet Take 7.5 mg by mouth daily. 1 tablet as need  . HYDROcodone-acetaminophen (NORCO/VICODIN) 5-325 MG tablet Take 1 tablet by mouth every 6 (six) hours as needed for moderate pain.  Marland Kitchen ibuprofen (ADVIL,MOTRIN) 800 MG tablet Take 1 tablet (800 mg total) by mouth every 8 (eight) hours as needed.   No current facility-administered medications on file prior to visit.     Allergies:   Penicillins   Social History   Tobacco Use  . Smoking status: Never Smoker  . Smokeless tobacco: Never Used  Substance Use Topics  . Alcohol use: Yes    Alcohol/week: 1.0 standard drinks    Types: 1 Glasses of wine per week    Comment: occasional  . Drug use: Never    Family History:  father had MI, had bypass surgery, was overweight, smoker. Issues started in early 92s. Brother similar pattern, had aortic aneurysm. Older sister (13 years older) has a rhythm issue, has had an ablation. Older sister (62 years older) had an MI recently, overweight.  ROS:   Please see the history of present illness.  Additional pertinent ROS: Constitutional: Negative for chills, fever, night sweats, unintentional weight loss  HENT: Negative for ear pain and hearing loss.   Eyes: Negative for loss of vision and eye pain.  Respiratory: Negative for cough, sputum, wheezing.   Cardiovascular: See HPI. Gastrointestinal: Negative for abdominal pain, melena, and hematochezia.  Genitourinary: Negative for dysuria and hematuria.  Musculoskeletal: Negative for falls and myalgias.  Skin: Negative for itching and rash.  Neurological: Negative for focal weakness, focal sensory changes and loss of consciousness.  Endo/Heme/Allergies: Does not bruise/bleed easily.     EKGs/Labs/Other Studies Reviewed:    The following studies were reviewed today: See HPI  EKG:  EKG is personally reviewed.  The ekg ordered today demonstrates sinus bradycardia, unchanged nonspecific TWI early precordial leads.  Recent Labs: No  results found for requested labs within last 8760 hours.  Recent Lipid Panel No results found for: CHOL, TRIG, HDL, CHOLHDL, VLDL, LDLCALC, LDLDIRECT  Physical Exam:    VS:  BP 131/72   Pulse (!) 48   Temp 97.9 F (36.6 C)   Ht 6' (1.829 m)   Wt 171 lb 12.8 oz (77.9 kg)   SpO2 97%   BMI 23.30 kg/m     Wt Readings from Last 3 Encounters:  09/16/19 171 lb 12.8 oz (77.9 kg)  06/02/18 185 lb (83.9 kg)  05/20/18 185 lb (83.9 kg)    GEN: Well nourished, well developed in no acute distress HEENT: Normal, moist mucous membranes NECK: No JVD CARDIAC: regular rhythm, normal S1 and S2, no rubs or gallops. No murmurs. VASCULAR: Radial and DP pulses 2+ bilaterally. No carotid bruits RESPIRATORY:  Clear to auscultation without rales, wheezing or rhonchi  ABDOMEN: Soft, non-tender, non-distended MUSCULOSKELETAL:  Ambulates independently SKIN: Warm and dry, no edema NEUROLOGIC:  Alert and oriented x 3. No focal neuro deficits noted. PSYCHIATRIC:  Normal affect    ASSESSMENT:    1. Palpitation   2. Family history of heart disease   3. Cardiac risk counseling   4. Counseling on health promotion and disease prevention  PLAN:    Exertional palpitations: not captured on KardiaMobile. We discussed event monitor, limitations if he does not have event. He can typically provoke events with heavy exertion and feels confident he will be able to capture an event. -2 week Zio patch -ECG unremarkable today -Kardia mobile strips reviewed, no clear arrhythmias -counseled on red flag signs that need immediate medical attention  Family history of heart disease: Cardiac risk counseling and prevention recommendations: -recommend heart healthy/Mediterranean diet, with whole grains, fruits, vegetable, fish, lean meats, nuts, and olive oil. Limit salt. -recommend moderate walking, 3-5 times/week for 30-50 minutes each session. Aim for at least 150 minutes.week. Goal should be pace of 3 miles/hours, or  walking 1.5 miles in 30 minutes. He far exceeds this with his current exercise regimen. -recommend avoidance of tobacco products. Avoid excess alcohol.  Plan for follow up: 4-6 weeks  Medication Adjustments/Labs and Tests Ordered: Current medicines are reviewed at length with the patient today.  Concerns regarding medicines are outlined above.  Orders Placed This Encounter  Procedures  . LONG TERM MONITOR (3-14 DAYS)  . EKG 12-Lead   No orders of the defined types were placed in this encounter.   Patient Instructions  Medication Instructions:  Your Physician recommend you continue on your current medication as directed.    *If you need a refill on your cardiac medications before your next appointment, please call your pharmacy*  Lab Work: None  Testing/Procedures: Our physician has recommended that you wear an [place] monitor. The Zio patch cardiac monitor continuously records heart rhythm data for up to 14 days, this is for patients being evaluated for multiple types heart rhythms. For the first 24 hours post application, please avoid getting the Zio monitor wet in the shower or by excessive sweating during exercise. After that, feel free to carry on with regular activities. Keep soaps and lotions away from the ZIO XT Patch.   Someone from our office will call to mail monitor.      Follow-Up: At [facility], you and your health needs are our priority.  As part of our continuing mission to provide you with exceptional heart care, we have created designated Provider Care Teams.  These Care Teams include your primary Cardiologist (physician) and Advanced Practice Providers (APPs -  Physician Assistants and Nurse Practitioners) who all work together to provide you with the care you need, when you need it.  Your next appointment:   4-6 week(s)  The format for your next appointment:   Virtual Visit   Provider:   Buford Dresser, MD  [place] Monitor  Instructions   Your physician has requested you wear your ZIO patch monitor__14_____days.   This is a single patch monitor.  [employer] supplies one patch monitor per enrollment.  Additional stickers are not available.   Please do not apply patch if you will be having a Nuclear Stress Test, Echocardiogram, Cardiac CT, MRI, or Chest Xray during the time frame you would be wearing the monitor. The patch cannot be worn during these tests.  You cannot remove and re-apply the ZIO XT patch monitor.   Your ZIO patch monitor will be sent USPS Priority mail from [facility] directly to your home address. The monitor may also be mailed to a PO BOX if home delivery is not available.   It may take 3-5 days to receive your monitor after you have been enrolled.   Once you have received you monitor, please review enclosed  instructions.  Your monitor has already been registered assigning a specific monitor serial # to you.   Applying the monitor   Shave hair from upper left chest.   Hold abrader disc by orange tab.  Rub abrader in 40 strokes over left upper chest as indicated in your monitor instructions.   Clean area with 4 enclosed alcohol pads .  Use all pads to assure are is cleaned thoroughly.  Let dry.   Apply patch as indicated in monitor instructions.  Patch will be place under collarbone on left side of chest with arrow pointing upward.   Rub patch adhesive wings for 2 minutes.Remove white label marked "1".  Remove white label marked "2".  Rub patch adhesive wings for 2 additional minutes.   While looking in a mirror, press and release button in center of patch.  A small green light will flash 3-4 times .  This will be your only indicator the monitor has been turned on.     Do not shower for the first 24 hours.  You may shower after the first 24 hours.   Press button if you feel a symptom. You will hear a small click.  Record Date, Time and Symptom in the Patient Log Book.   When you are  ready to remove patch, follow instructions on last 2 pages of Patient Log Book.  Stick patch monitor onto last page of Patient Log Book.   Place Patient Log Book in [place] box.  Use locking tab on box and tape box closed securely.  The [employer] and [employer] has [employer] on it.  Please place in mailbox as soon as possible.  Your physician should have your test results approximately 7 days after the monitor has been mailed back to [facility].   Call [place] at [phone] if you have questions regarding your ZIO XT patch monitor.  Call them immediately if you see an orange light blinking on your monitor.   If your monitor falls off in less than 4 days contact our Monitor department at [phone].  If your monitor becomes loose or falls off after 4 days call [employer] at [phone] for suggestions on securing your monitor.       Signed, Buford Dresser, MD PhD 09/16/2019 7:42 PM    [place]

## 2019-09-16 NOTE — Telephone Encounter (Signed)
Enrolled patient for a 14 day Zio monitor to be mailed to patients home.  

## 2019-09-16 NOTE — Patient Instructions (Addendum)
Medication Instructions:  Your Physician recommend you continue on your current medication as directed.    *If you need a refill on your cardiac medications before your next appointment, please call your pharmacy*  Lab Work: None  Testing/Procedures: Our physician has recommended that you wear an [place] monitor. The Zio patch cardiac monitor continuously records heart rhythm data for up to 14 days, this is for patients being evaluated for multiple types heart rhythms. For the first 24 hours post application, please avoid getting the Zio monitor wet in the shower or by excessive sweating during exercise. After that, feel free to carry on with regular activities. Keep soaps and lotions away from the ZIO XT Patch.   Someone from our office will call to mail monitor.      Follow-Up: At [facility], you and your health needs are our priority.  As part of our continuing mission to provide you with exceptional heart care, we have created designated Provider Care Teams.  These Care Teams include your primary Cardiologist (physician) and Advanced Practice Providers (APPs -  Physician Assistants and Nurse Practitioners) who all work together to provide you with the care you need, when you need it.  Your next appointment:   4-6 week(s)  The format for your next appointment:   Virtual Visit   Provider:   Buford Dresser, MD  [place] Monitor Instructions   Your physician has requested you wear your ZIO patch monitor__14_____days.   This is a single patch monitor.  [employer] supplies one patch monitor per enrollment.  Additional stickers are not available.   Please do not apply patch if you will be having a Nuclear Stress Test, Echocardiogram, Cardiac CT, MRI, or Chest Xray during the time frame you would be wearing the monitor. The patch cannot be worn during these tests.  You cannot remove and re-apply the ZIO XT patch monitor.   Your ZIO patch monitor will be sent  USPS Priority mail from [facility] directly to your home address. The monitor may also be mailed to a PO BOX if home delivery is not available.   It may take 3-5 days to receive your monitor after you have been enrolled.   Once you have received you monitor, please review enclosed instructions.  Your monitor has already been registered assigning a specific monitor serial # to you.   Applying the monitor   Shave hair from upper left chest.   Hold abrader disc by orange tab.  Rub abrader in 40 strokes over left upper chest as indicated in your monitor instructions.   Clean area with 4 enclosed alcohol pads .  Use all pads to assure are is cleaned thoroughly.  Let dry.   Apply patch as indicated in monitor instructions.  Patch will be place under collarbone on left side of chest with arrow pointing upward.   Rub patch adhesive wings for 2 minutes.Remove white label marked "1".  Remove white label marked "2".  Rub patch adhesive wings for 2 additional minutes.   While looking in a mirror, press and release button in center of patch.  A small green light will flash 3-4 times .  This will be your only indicator the monitor has been turned on.     Do not shower for the first 24 hours.  You may shower after the first 24 hours.   Press button if you feel a symptom. You will hear a small click.  Record Date, Time and  Symptom in the Patient Log Book.   When you are ready to remove patch, follow instructions on last 2 pages of Patient Log Book.  Stick patch monitor onto last page of Patient Log Book.   Place Patient Log Book in [place] box.  Use locking tab on box and tape box closed securely.  The [employer] has [employer] on it.  Please place in mailbox as soon as possible.  Your physician should have your test results approximately 7 days after the monitor has been mailed back to [facility].   Call [place] at [phone] if you have questions  regarding your ZIO XT patch monitor.  Call them immediately if you see an orange light blinking on your monitor.   If your monitor falls off in less than 4 days contact our Monitor department at [phone].  If your monitor becomes loose or falls off after 4 days call [employer] at [phone] for suggestions on securing your monitor.

## 2019-09-20 ENCOUNTER — Ambulatory Visit (INDEPENDENT_AMBULATORY_CARE_PROVIDER_SITE_OTHER): Payer: 59

## 2019-09-20 DIAGNOSIS — R002 Palpitations: Secondary | ICD-10-CM

## 2019-10-12 ENCOUNTER — Telehealth: Payer: Self-pay | Admitting: Cardiology

## 2019-10-12 NOTE — Telephone Encounter (Signed)
New Message  Received a call from [facility] to give a report about the pt

## 2019-10-12 NOTE — Telephone Encounter (Signed)
Received call from [facility] with critical report on patient.  States patient wore monitor for 2 weeks.   Had 10 episodes of SVT and patient was symptomatic.   Reports episode on strip 11 HR 190 lasting longer than 1 min.      Reports report is available for review.    Lala Lund aware and will upload report for MD to review.

## 2019-10-28 ENCOUNTER — Encounter: Payer: Self-pay | Admitting: Cardiology

## 2019-10-28 ENCOUNTER — Telehealth (INDEPENDENT_AMBULATORY_CARE_PROVIDER_SITE_OTHER): Payer: 59 | Admitting: Cardiology

## 2019-10-28 VITALS — BP 119/79 | HR 51 | Ht 72.0 in | Wt 169.0 lb

## 2019-10-28 DIAGNOSIS — I471 Supraventricular tachycardia: Secondary | ICD-10-CM | POA: Diagnosis not present

## 2019-10-28 DIAGNOSIS — R002 Palpitations: Secondary | ICD-10-CM | POA: Diagnosis not present

## 2019-10-28 DIAGNOSIS — Z8249 Family history of ischemic heart disease and other diseases of the circulatory system: Secondary | ICD-10-CM

## 2019-10-28 DIAGNOSIS — Z7189 Other specified counseling: Secondary | ICD-10-CM

## 2019-10-28 DIAGNOSIS — Z712 Person consulting for explanation of examination or test findings: Secondary | ICD-10-CM

## 2019-10-28 NOTE — Progress Notes (Signed)
Virtual Visit via Video Note   This visit type was conducted due to national recommendations for restrictions regarding the COVID-19 Pandemic (e.g. social distancing) in an effort to limit this patient's exposure and mitigate transmission in our community.  Due to his co-morbid illnesses, this patient is at least at moderate risk for complications without adequate follow up.  This format is felt to be most appropriate for this patient at this time.  All issues noted in this document were discussed and addressed.  A limited physical exam was performed with this format.  Please refer to the patient's chart for his consent to telehealth for [facility].   The patient was identified using 2 identifiers.  Date:  10/28/2019   ID:  Andrew Matthews, DOB 1960/12/14, MRN [id number]  Patient Location: Home Provider Location: Office  PCP:  Vernie Shanks, MD  Cardiologist:  Buford Dresser, MD  Electrophysiologist:  None   Evaluation Performed:  Follow-Up Visit  Chief Complaint:  Follow up  History of Present Illness:    Andrew Matthews is a 59 y.o. male with history of palpitations.  The patient does not have symptoms concerning for COVID-19 infection (fever, chills, cough, or new shortness of breath).   Reviewed results of monitor, see below for report. 10 episodes of SVT, longest ~6 minutes. His symptom triggered events were sinus, but around the time of SVT events.  Discussed what this means, options for management. We reviewed the prior testing he has done with Dr. Wynonia Lawman.  He remains very active, cycling without issues. Discussed medication such as metoprolol or diltiazem, but concern that this may affect his cycling.  Denies chest pain, shortness of breath at rest or with normal exertion. No PND, orthopnea, LE edema or unexpected weight gain. No syncope.   Past Medical History:  Diagnosis Date  . Arthritis    osteoarthritis lower back  . Dysrhythmia    Arrythmias  with  workouts See's Dr. Wynonia Lawman   Past Surgical History:  Procedure Laterality Date  . hemmhroiectomy    . INGUINAL HERNIA REPAIR Bilateral 06/02/2018   Procedure: LAPAROSCOPIC BILATERAL INGUINAL HERNIA REPAIR;  Surgeon: Kieth Brightly, Arta Bruce, MD;  Location: [facility];  Service: General;  Laterality: Bilateral;  . INSERTION OF MESH Bilateral 06/02/2018   Procedure: INSERTION OF MESH;  Surgeon: Kinsinger, Arta Bruce, MD;  Location: [facility];  Service: General;  Laterality: Bilateral;  . SHOULDER SURGERY     manipulation under anesthesia  . VASECTOMY       Current Meds  Medication Sig  . doxylamine, Sleep, (SLEEP AID) 25 MG tablet Take 25 mg by mouth at bedtime as needed. As needed for sleep  . fexofenadine-pseudoephedrine (ALLEGRA-D 24) 180-240 MG 24 hr tablet Take 1 tablet by mouth daily.  Marland Kitchen ibuprofen (ADVIL,MOTRIN) 800 MG tablet Take 1 tablet (800 mg total) by mouth every 8 (eight) hours as needed.  . Melatonin 1 MG CAPS Take 1 mg by mouth. 1 tablet at bedtime  . meloxicam (MOBIC) 7.5 MG tablet Take 7.5 mg by mouth daily. 1 tablet as need     Allergies:   Penicillins   Social History   Tobacco Use  . Smoking status: Never Smoker  . Smokeless tobacco: Never Used  Substance Use Topics  . Alcohol use: Yes    Alcohol/week: 1.0 standard drinks    Types: 1 Glasses of wine per week    Comment: occasional  . Drug use: Never     Family Hx:  father had MI,  had bypass surgery, was overweight, smoker. Issues started in early 41s. Brother similar pattern, had aortic aneurysm. Older sister (84 years older) has a rhythm issue, has had an ablation. Older sister (48 years older) had an MI recently, overweight.  ROS:   Please see the history of present illness.    All other systems reviewed and are negative.   Prior CV studies:   The following studies were reviewed today:  Monitor 10/27/19 14 days of data recorded on Zio monitor. Patient had a min HR of 37 bpm, max HR of 203 bpm, and avg HR of  56 bpm. Predominant underlying rhythm was Sinus Rhythm. No VT, atrial fibrillation, high degree block, or pauses noted. 10 SVT events notes, longest was 6 mins 24 seconds, with maximum rate of 203 bpm. Isolated atrial and ventricular ectopy was rare (<1%). There were 9 triggered events. These were sinus rhythm, many near periods of SVT.  Labs/Other Tests and Data Reviewed:    EKG:  An ECG dated 09/16/19 was personally reviewed today and demonstrated:  sinus bradycardia, nonspecific TWI  Recent Labs: No results found for requested labs within last 8760 hours.   Recent Lipid Panel No results found for: CHOL, TRIG, HDL, CHOLHDL, LDLCALC, LDLDIRECT  Wt Readings from Last 3 Encounters:  10/28/19 169 lb (76.7 kg)  09/16/19 171 lb 12.8 oz (77.9 kg)  06/02/18 185 lb (83.9 kg)     Objective:    Vital Signs:  BP 119/79   Pulse (!) 51   Ht 6' (1.829 m)   Wt 169 lb (76.7 kg)   BMI 22.92 kg/m    VITAL SIGNS:  reviewed GEN:  no acute distress EYES:  sclerae anicteric, EOMI - Extraocular Movements Intact RESPIRATORY:  normal respiratory effort, symmetric expansion CARDIOVASCULAR:  no visible JVD SKIN:  no rash, lesions or ulcers. MUSCULOSKELETAL:  no obvious deformities. NEURO:  alert and oriented x 3, no obvious focal deficit PSYCH:  normal affect  ASSESSMENT & PLAN:    Exertional palpitations:  -reviewed results of monitor today. Has paroxysmal SVT, longest 6 minutes -discussed management options. After shared decision making, will continue to monitor -discussed vagal maneuvers -counseled on red flag signs that need immediate medical attention  Family history of heart disease: Cardiac risk counseling and prevention recommendations: -recommend heart healthy/Mediterranean diet, with whole grains, fruits, vegetable, fish, lean meats, nuts, and olive oil. Limit salt. -recommend moderate walking, 3-5 times/week for 30-50 minutes each session. Aim for at least 150 minutes.week. Goal  should be pace of 3 miles/hours, or walking 1.5 miles in 30 minutes. He far exceeds this with his current exercise regimen. -recommend avoidance of tobacco products. Avoid excess alcohol.  COVID-19 Education: The signs and symptoms of COVID-19 were discussed with the patient and how to seek care for testing (follow up with PCP or arrange E-visit).  The importance of social distancing was discussed today.  Time:   Today, I have spent 12 minutes with the patient with telehealth technology discussing the above problems.     Patient Instructions  Medication Instructions:  Your Physician recommend you continue on your current medication as directed.    *If you need a refill on your cardiac medications before your next appointment, please call your pharmacy*   Lab Work: None   Testing/Procedures: None   Follow-Up: At [facility], you and your health needs are our priority.  As part of our continuing mission to provide you with exceptional heart care, we have created designated Provider Care  Teams.  These Care Teams include your primary Cardiologist (physician) and Advanced Practice Providers (APPs -  Physician Assistants and Nurse Practitioners) who all work together to provide you with the care you need, when you need it.  We recommend signing up for the patient portal called "MyChart".  Sign up information is provided on this After Visit Summary.  MyChart is used to connect with patients for Virtual Visits (Telemedicine).  Patients are able to view lab/test results, encounter notes, upcoming appointments, etc.  Non-urgent messages can be sent to your provider as well.   To learn more about what you can do with MyChart, go to [email or web address].    Your next appointment:   1 year(s)  The format for your next appointment:   In Person  Provider:   Buford Dresser, MD       Signed, Buford Dresser, MD  10/28/2019 5:01 PM    [place]

## 2019-10-28 NOTE — Patient Instructions (Signed)

## 2019-12-16 ENCOUNTER — Encounter: Payer: Self-pay | Admitting: Cardiology

## 2020-01-25 MED ORDER — DILTIAZEM HCL 30 MG PO TABS: 30.0000 mg | ORAL_TABLET | ORAL | 11 refills | Status: DC | PRN

## 2020-01-25 NOTE — Telephone Encounter (Signed)
Pt updated with MD's recommendation and verbalized understanding.  

## 2020-01-25 NOTE — Telephone Encounter (Signed)
Can you call pt and send a prescription for 30 mg short acting dilitazem q4 hours PRN for rapid heart rate? He should take about an hour before bike rides to see if it helps.

## 2020-11-01 ENCOUNTER — Ambulatory Visit: Payer: 59 | Admitting: Cardiology

## 2020-11-03 ENCOUNTER — Ambulatory Visit: Payer: 59 | Admitting: Cardiology

## 2020-11-22 ENCOUNTER — Ambulatory Visit (INDEPENDENT_AMBULATORY_CARE_PROVIDER_SITE_OTHER): Payer: 59 | Admitting: Cardiology

## 2020-11-22 ENCOUNTER — Other Ambulatory Visit: Payer: Self-pay

## 2020-11-22 ENCOUNTER — Encounter: Payer: Self-pay | Admitting: Cardiology

## 2020-11-22 VITALS — BP 118/60 | HR 44 | Ht 72.0 in | Wt 175.6 lb

## 2020-11-22 DIAGNOSIS — R002 Palpitations: Secondary | ICD-10-CM | POA: Diagnosis not present

## 2020-11-22 DIAGNOSIS — Z7189 Other specified counseling: Secondary | ICD-10-CM

## 2020-11-22 DIAGNOSIS — Z8249 Family history of ischemic heart disease and other diseases of the circulatory system: Secondary | ICD-10-CM | POA: Diagnosis not present

## 2020-11-22 DIAGNOSIS — I471 Supraventricular tachycardia: Secondary | ICD-10-CM

## 2020-11-22 NOTE — Patient Instructions (Signed)
Medication Instructions:  No change *If you need a refill on your cardiac medications before your next appointment, please call your pharmacy*   Lab Work: None today If you have labs (blood work) drawn today and your tests are completely normal, you will receive your results only by: Marland Kitchen MyChart Message (if you have MyChart) OR . A paper copy in the mail If you have any lab test that is abnormal or we need to change your treatment, we will call you to review the results.   Testing/Procedures: None   Follow-Up: At [facility], you and your health needs are our priority.  As part of our continuing mission to provide you with exceptional heart care, we have created designated Provider Care Teams.  These Care Teams include your primary Cardiologist (physician) and Advanced Practice Providers (APPs -  Physician Assistants and Nurse Practitioners) who all work together to provide you with the care you need, when you need it.   Your next appointment:   2 years  The format for your next appointment:   In person  Provider:   Buford Dresser, MD  At [facility]

## 2020-11-22 NOTE — Progress Notes (Signed)
Cardiology Office Note:    Date:  11/22/2020   ID:  Andrew Matthews, DOB 1961/05/27, MRN [id number]  PCP:  Vernie Shanks, MD  Cardiologist:  Buford Dresser, MD  Referring MD: Vernie Shanks, MD   Chief Complaint:  Follow up  History of Present Illness:    Andrew Matthews is a 60 y.o. male with history of palpitations, paroxysmal SVT.  Today: Bring lipids from 11/20/20. Tchol 190, HDL 72, LDL 105, TG 67.   Palpitations with same frequency, hasn't been as persistent. More once/month to every 6 weeks. If he takes diltiazem before bike rides, he does well. Does have rare breakthrough palpitations with diltiazem, but these are brief. Remains very active without limitations.   Denies chest pain, shortness of breath at rest or with normal exertion. No PND, orthopnea, LE edema or unexpected weight gain. No syncope.   Past Medical History:  Diagnosis Date  . Arthritis    osteoarthritis lower back  . Dysrhythmia    Arrythmias  with workouts See's Dr. Wynonia Lawman   Past Surgical History:  Procedure Laterality Date  . hemmhroiectomy    . INGUINAL HERNIA REPAIR Bilateral 06/02/2018   Procedure: LAPAROSCOPIC BILATERAL INGUINAL HERNIA REPAIR;  Surgeon: Kieth Brightly, Arta Bruce, MD;  Location: [facility];  Service: General;  Laterality: Bilateral;  . INSERTION OF MESH Bilateral 06/02/2018   Procedure: INSERTION OF MESH;  Surgeon: Kinsinger, Arta Bruce, MD;  Location: [facility];  Service: General;  Laterality: Bilateral;  . SHOULDER SURGERY     manipulation under anesthesia  . VASECTOMY       Current Meds  Medication Sig  . diltiazem (CARDIZEM) 30 MG tablet Take 1 tablet (30 mg total) by mouth every 4 (four) hours as needed (for elevated Heart rate and 1 hour prior to bike ride).  Marland Kitchen doxylamine, Sleep, (SLEEP AID) 25 MG tablet Take 25 mg by mouth at bedtime as needed. As needed for sleep  . fexofenadine-pseudoephedrine (ALLEGRA-D 24) 180-240 MG 24 hr tablet Take 1 tablet by mouth daily.  Marland Kitchen  ibuprofen (ADVIL,MOTRIN) 800 MG tablet Take 1 tablet (800 mg total) by mouth every 8 (eight) hours as needed.  . Melatonin 1 MG CAPS Take 1 mg by mouth as needed. 1 tablet at bedtime  . meloxicam (MOBIC) 7.5 MG tablet Take 7.5 mg by mouth daily. 1 tablet as need     Allergies:   Penicillins   Social History   Tobacco Use  . Smoking status: Never Smoker  . Smokeless tobacco: Never Used  Vaping Use  . Vaping Use: Never used  Substance Use Topics  . Alcohol use: Yes    Alcohol/week: 1.0 standard drink    Types: 1 Glasses of wine per week    Comment: occasional  . Drug use: Never     Family Hx:  father had MI, had bypass surgery, was overweight, smoker. Issues started in early 73s. Brother similar pattern, had aortic aneurysm. Older sister (18 years older) has a rhythm issue, has had an ablation. Older sister (29 years older) had an MI recently, overweight.  ROS:   Please see the history of present illness.    All other systems reviewed and are negative.   Prior CV studies:   The following studies were reviewed today:  Monitor 10/27/19 14 days of data recorded on Zio monitor. Patient had a min HR of 37 bpm, max HR of 203 bpm, and avg HR of 56 bpm. Predominant underlying rhythm was Sinus Rhythm. No VT, atrial fibrillation,  high degree block, or pauses noted. 10 SVT events notes, longest was 6 mins 24 seconds, with maximum rate of 203 bpm. Isolated atrial and ventricular ectopy was rare (<1%). There were 9 triggered events. These were sinus rhythm, many near periods of SVT.  Labs/Other Tests and Data Reviewed:    EKG:  ECG is personally reviewed. ECG dated 11/22/20 shows sinus bradycardia at 44 bpm with nonspecific anterior T wave pattern. 09/16/19: sinus bradycardia, nonspecific TWI  Recent Labs: No results found for requested labs within last 8760 hours.   Recent Lipid Panel No results found for: CHOL, TRIG, HDL, CHOLHDL, LDLCALC, LDLDIRECT  Wt Readings from Last 3 Encounters:   11/22/20 175 lb 9.6 oz (79.7 kg)  10/28/19 169 lb (76.7 kg)  09/16/19 171 lb 12.8 oz (77.9 kg)     Objective:    Vital Signs:  BP 118/60   Pulse (!) 44   Ht 6' (1.829 m)   Wt 175 lb 9.6 oz (79.7 kg)   BMI 23.82 kg/m    GEN: Well nourished, well developed in no acute distress HEENT: Normal, moist mucous membranes NECK: No JVD CARDIAC: regular rhythm, normal S1 and S2, no rubs or gallops. No murmur. VASCULAR: Radial and DP pulses 2+ bilaterally. No carotid bruits RESPIRATORY:  Clear to auscultation without rales, wheezing or rhonchi  ABDOMEN: Soft, non-tender, non-distended MUSCULOSKELETAL:  Ambulates independently SKIN: Warm and dry, no edema NEUROLOGIC:  Alert and oriented x 3. No focal neuro deficits noted. PSYCHIATRIC:  Normal affect   ASSESSMENT & PLAN:    Paroxysmal SVT (supraventricular tachycardia) (HCC) - Plan: EKG 12-Lead  Palpitation  Family history of heart disease  Cardiac risk counseling  Counseling on health promotion and disease prevention   Exertional palpitations, paroxysmal SVT:  -Monitor shows paroxysmal SVT, longest 6 minutes -discussed management options. Doing well with pre-ride diltiazem on the weekends when he does extended bike riding -we have discussed vagal maneuvers for prolonged episodes -counseled on red flag signs that need immediate medical attention  Family history of heart disease: Cardiac risk counseling and prevention recommendations: -recommend heart healthy/Mediterranean diet, with whole grains, fruits, vegetable, fish, lean meats, nuts, and olive oil. Limit salt. -recommend moderate walking, 3-5 times/week for 30-50 minutes each session. Aim for at least 150 minutes.week. Goal should be pace of 3 miles/hours, or walking 1.5 miles in 30 minutes. He far exceeds this with his current exercise regimen. -recommend avoidance of tobacco products. Avoid excess alcohol.  Follow up: two years or sooner as needed  No orders of the  defined types were placed in this encounter.  Orders Placed This Encounter  Procedures  . EKG 12-Lead    Patient Instructions  Medication Instructions:  No change *If you need a refill on your cardiac medications before your next appointment, please call your pharmacy*   Lab Work: None today If you have labs (blood work) drawn today and your tests are completely normal, you will receive your results only by: Marland Kitchen MyChart Message (if you have MyChart) OR . A paper copy in the mail If you have any lab test that is abnormal or we need to change your treatment, we will call you to review the results.   Testing/Procedures: None   Follow-Up: At [facility], you and your health needs are our priority.  As part of our continuing mission to provide you with exceptional heart care, we have created designated Provider Care Teams.  These Care Teams include your primary Cardiologist (physician) and Advanced Practice  Providers (APPs -  Physician Assistants and Nurse Practitioners) who all work together to provide you with the care you need, when you need it.   Your next appointment:   2 years  The format for your next appointment:   In person  Provider:   Buford Dresser, MD  At [facility])   Signed, Buford Dresser, MD  11/22/2020  [facility]

## 2021-03-14 ENCOUNTER — Other Ambulatory Visit: Payer: Self-pay | Admitting: Cardiology

## 2021-03-14 ENCOUNTER — Encounter (HOSPITAL_BASED_OUTPATIENT_CLINIC_OR_DEPARTMENT_OTHER): Payer: Self-pay

## 2022-01-21 ENCOUNTER — Telehealth: Payer: Self-pay | Admitting: Cardiology

## 2022-01-21 NOTE — Telephone Encounter (Signed)
   Pre-operative Risk Assessment    Patient Name: Andrew Matthews  DOB: 03/27/1961 MRN: [id number]      Request for Surgical Clearance    Procedure:   Excision mass left thumb pulp  Date of Surgery:  Clearance 01/31/22                                 Surgeon:  Dr. Leanora Cover Surgeon's Group or Practice Name:  [facility] Phone number:  [phone] Fax number:  [phone]   Type of Clearance Requested:   - Medical    Type of Anesthesia:   IV Regional forearm block   Additional requests/questions:    Crist Infante   01/21/2022, 2:25 PM

## 2022-01-21 NOTE — Telephone Encounter (Signed)
   Name: Andrew Matthews  DOB: 22-Jan-1961  MRN: [id number]  Primary Cardiologist: Buford Dresser, MD  Chart reviewed as part of pre-operative protocol coverage. Because of [place] Skalicky past medical history and time since last visit, he will require a follow-up in-office visit in order to better assess preoperative cardiovascular risk.  Pre-op covering staff: - Please schedule appointment and call patient to inform them. If patient already had an upcoming appointment within acceptable timeframe, please add "pre-op clearance" to the appointment notes so provider is aware. - Please contact requesting surgeon's office via preferred method (i.e, phone, fax) to inform them of need for appointment prior to surgery.   [place], [place]  01/21/2022, 3:34 PM

## 2022-01-21 NOTE — Telephone Encounter (Signed)
Left message for the pt to call the office for an IN OFFICE appt for pre op. Pt can see Dr. Buford Dresser or APP.

## 2022-01-22 ENCOUNTER — Other Ambulatory Visit: Payer: Self-pay | Admitting: Orthopedic Surgery

## 2022-01-22 NOTE — Telephone Encounter (Signed)
Patient returned call. Pre op appt has been scheduled for 06/09 with Dr. Harrell Gave.

## 2022-01-23 ENCOUNTER — Encounter (HOSPITAL_BASED_OUTPATIENT_CLINIC_OR_DEPARTMENT_OTHER): Payer: Self-pay | Admitting: Orthopedic Surgery

## 2022-01-23 ENCOUNTER — Other Ambulatory Visit: Payer: Self-pay

## 2022-01-25 ENCOUNTER — Ambulatory Visit (INDEPENDENT_AMBULATORY_CARE_PROVIDER_SITE_OTHER): Payer: 59 | Admitting: Cardiology

## 2022-01-25 ENCOUNTER — Encounter (HOSPITAL_BASED_OUTPATIENT_CLINIC_OR_DEPARTMENT_OTHER): Payer: Self-pay | Admitting: Cardiology

## 2022-01-25 VITALS — BP 122/76 | HR 41 | Ht 72.0 in | Wt 177.2 lb

## 2022-01-25 DIAGNOSIS — I471 Supraventricular tachycardia, unspecified: Secondary | ICD-10-CM

## 2022-01-25 DIAGNOSIS — Z7189 Other specified counseling: Secondary | ICD-10-CM

## 2022-01-25 DIAGNOSIS — Z01810 Encounter for preprocedural cardiovascular examination: Secondary | ICD-10-CM | POA: Diagnosis not present

## 2022-01-25 MED ORDER — DILTIAZEM HCL 30 MG PO TABS: ORAL_TABLET | ORAL | 11 refills | Status: DC

## 2022-01-25 NOTE — Progress Notes (Signed)
Cardiology Office Note:    Date:  01/25/2022   ID:  Andrew Matthews, DOB 1960-09-22, MRN [id number]  PCP:  Vernie Shanks, MD  Cardiologist:  Buford Dresser, MD  Referring MD: Vernie Shanks, MD   Chief Complaint:  Follow up  History of Present Illness:    Andrew Matthews is a 61 y.o. male with history of palpitations, paroxysmal SVT. He is seen today for pre-op clearance.  He is scheduled for an excision of a mass on his left thumb on 01/31/22 with Dr. Leanora Cover at [facility].  Today:  Generally he is feeling fine with no chest pain, shortness of breath, or syncope. Since his last visit he states that "nothing really changed."  His left thumb cyst has been growing for years, but 4 weeks ago has been swelling excessively. It used to come and go, but now has only been growing.  He is still taking the diltiazem and reports doing well. He takes it before riding a bike as a preventative. He is very active and bikes often. Additionally, he will work out at home without experiencing palpitations.  Occasionally his palpitations will occur at other times. Sleeping poorly and drinking wine often trigger his palpitations. Sometimes palpations happen without those triggers. Sometimes he will get palpitations several weeks in a row, and afterwards he will go long periods of time without any palpitations. The patient reports once pushing himself and "Charging up a hill" when riding a bicycle with a friend and having more severe palpitations for 15 minutes afterwards. Upon resting the issue resolved and he biked home.   His brother passed away, who had dementia and multiple myeloma. His older sister has arhythmia as well, but hers is more severe and she needed an ablation.  He denies any chest pain, shortness of breath, or peripheral edema. No lightheadedness, headaches, syncope, orthopnea, or PND.   Patient plans to retire in 3 years.  Past Medical History:  Diagnosis  Date   Arthritis    osteoarthritis lower back   Dysrhythmia    Arrythmias  with workouts See's Dr. Wynonia Lawman   Past Surgical History:  Procedure Laterality Date   hemmhroiectomy     INGUINAL HERNIA REPAIR Bilateral 06/02/2018   Procedure: LAPAROSCOPIC BILATERAL INGUINAL HERNIA REPAIR;  Surgeon: Kieth Brightly, Arta Bruce, MD;  Location: [facility];  Service: General;  Laterality: Bilateral;   INSERTION OF MESH Bilateral 06/02/2018   Procedure: INSERTION OF MESH;  Surgeon: Mickeal Skinner, MD;  Location: [facility];  Service: General;  Laterality: Bilateral;   SHOULDER SURGERY     manipulation under anesthesia   VASECTOMY       Current Meds  Medication Sig   doxylamine, Sleep, (SLEEP AID) 25 MG tablet Take 25 mg by mouth at bedtime as needed. As needed for sleep   fexofenadine-pseudoephedrine (ALLEGRA-D 24) 180-240 MG 24 hr tablet Take 1 tablet by mouth daily.   ibuprofen (ADVIL,MOTRIN) 800 MG tablet Take 1 tablet (800 mg total) by mouth every 8 (eight) hours as needed.   Melatonin 1 MG CAPS Take 1 mg by mouth as needed. 1 tablet at bedtime   meloxicam (MOBIC) 7.5 MG tablet Take 7.5 mg by mouth daily. 1 tablet as need   Multiple Vitamin (MULTIVITAMIN WITH MINERALS) TABS tablet Take 1 tablet by mouth daily.   [DISCONTINUED] diltiazem (CARDIZEM) 30 MG tablet TAKE 1 TAB BY MOUTH EVERY 4 HOURS AS NEEDED (FOR ELEVATED HEART RATE AND 1 HOUR PRIOR TO BIKE RIDE).  Allergies:   Penicillins   Social History   Tobacco Use   Smoking status: Never   Smokeless tobacco: Never  Vaping Use   Vaping Use: Never used  Substance Use Topics   Alcohol use: Yes    Alcohol/week: 1.0 standard drink of alcohol    Types: 1 Glasses of wine per week    Comment: social   Drug use: Never     Family Hx:  father had MI, had bypass surgery, was overweight, smoker. Issues started in early 35s. Brother similar pattern, had aortic aneurysm. Older sister (40 years older) has a rhythm issue, has had an ablation.  Older sister (11 years older) had an MI recently, overweight.  ROS:   Please see the history of present illness.    (+)Palpitations   All other systems reviewed and are negative.   Prior CV studies:   The following studies were reviewed today:  Monitor 10/27/19 14 days of data recorded on Zio monitor. Patient had a min HR of 37 bpm, max HR of 203 bpm, and avg HR of 56 bpm. Predominant underlying rhythm was Sinus Rhythm. No VT, atrial fibrillation, high degree block, or pauses noted. 10 SVT events notes, longest was 6 mins 24 seconds, with maximum rate of 203 bpm. Isolated atrial and ventricular ectopy was rare (<1%). There were 9 triggered events. These were sinus rhythm, many near periods of SVT.  Labs/Other Tests and Data Reviewed:     EKG:  EKG is personally reviewed.   01/25/22: sinus bradycardia at 41 bpm , with unchanged T wave pattern 11/22/20: shows sinus bradycardia at 44 bpm with nonspecific anterior T wave pattern. 09/16/19: sinus bradycardia, nonspecific TWI  Recent Labs: No results found for requested labs within last 365 days.   Recent Lipid Panel No results found for: "CHOL", "TRIG", "HDL", "CHOLHDL", "LDLCALC", "LDLDIRECT"  Wt Readings from Last 3 Encounters:  01/25/22 177 lb 3.2 oz (80.4 kg)  11/22/20 175 lb 9.6 oz (79.7 kg)  10/28/19 169 lb (76.7 kg)     Objective:    Vital Signs:  BP 122/76   Pulse (!) 41   Ht 6' (1.829 m)   Wt 177 lb 3.2 oz (80.4 kg)   SpO2 98%   BMI 24.03 kg/m    GEN: Well nourished, well developed in no acute distress HEENT: Normal, moist mucous membranes NECK: No JVD CARDIAC: regular rhythm, normal S1 and S2, no rubs or gallops. No murmur. VASCULAR: Radial and DP pulses 2+ bilaterally. No carotid bruits RESPIRATORY:  Clear to auscultation without rales, wheezing or rhonchi  ABDOMEN: Soft, non-tender, non-distended MUSCULOSKELETAL:  Ambulates independently SKIN: Warm and dry, no edema NEUROLOGIC:  Alert and oriented x 3. No  focal neuro deficits noted. PSYCHIATRIC:  Normal affect   ASSESSMENT & PLAN:    Paroxysmal SVT (supraventricular tachycardia) (HCC) - Plan: EKG 12-Lead, diltiazem (CARDIZEM) 30 MG tablet  Preop cardiovascular exam  Cardiac risk counseling  Counseling on health promotion and disease prevention   Preoperative cardiovascular exam: According to the Revised Cardiac Risk Index (RCRI), his Perioperative Risk of Major Cardiac Event is (%): 0.4  The patient is not currently having active cardiac symptoms, and they can achieve >4 METs of activity.  According to ACC/AHA Guidelines, no further testing is needed.  Proceed with surgery at acceptable risk.    Exertional palpitations, paroxysmal SVT:  -Monitor shows paroxysmal SVT, longest 6 minutes -Doing well with pre-ride diltiazem on the weekends when he does extended bike riding -we have  discussed vagal maneuvers for prolonged episodes -counseled on red flag signs that need immediate medical attention   Family history of heart disease: Cardiac risk counseling and prevention recommendations: -recommend heart healthy/Mediterranean diet, with whole grains, fruits, vegetable, fish, lean meats, nuts, and olive oil. Limit salt. -recommend moderate walking, 3-5 times/week for 30-50 minutes each session. Aim for at least 150 minutes.week. Goal should be pace of 3 miles/hours, or walking 1.5 miles in 30 minutes. He far exceeds this with his current exercise regimen. -recommend avoidance of tobacco products. Avoid excess alcohol.  Follow up: two years or sooner as needed   Meds ordered this encounter  Medications   diltiazem (CARDIZEM) 30 MG tablet    Sig: TAKE 1 TAB BY MOUTH EVERY 4 HOURS AS NEEDED (FOR ELEVATED HEART RATE AND 1 HOUR PRIOR TO BIKE RIDE).    Dispense:  20 tablet    Refill:  11   Orders Placed This Encounter  Procedures   EKG 12-Lead    Patient Instructions  Medication Instructions:  Your physician recommends that you  continue on your current medications as directed. Please refer to the Current Medication list given to you today.   *If you need a refill on your cardiac medications before your next appointment, please call your pharmacy*  Lab Work: NONE  Testing/Procedures: NONE  Follow-Up: At [facility], you and your health needs are our priority.  As part of our continuing mission to provide you with exceptional heart care, we have created designated Provider Care Teams.  These Care Teams include your primary Cardiologist (physician) and Advanced Practice Providers (APPs -  Physician Assistants and Nurse Practitioners) who all work together to provide you with the care you need, when you need it.  We recommend signing up for the patient portal called "MyChart".  Sign up information is provided on this After Visit Summary.  MyChart is used to connect with patients for Virtual Visits (Telemedicine).  Patients are able to view lab/test results, encounter notes, upcoming appointments, etc.  Non-urgent messages can be sent to your provider as well.   To learn more about what you can do with MyChart, go to [email or web address].    Your next appointment:   2 year(s)  The format for your next appointment:   In Person  Provider:   Buford Dresser, MD           [facility]ump[facility],acting as a scribe for Buford Dresser, MD.,have documented all relevant documentation on the behalf of Buford Dresser, MD,as directed by  Buford Dresser, MD while in the presence of Buford Dresser, MD.   I, Buford Dresser, MD, have reviewed all documentation for this visit. The documentation on 01/25/22 for the exam, diagnosis, procedures, and orders are all accurate and complete.   Signed, Buford Dresser, MD  01/25/2022  [facility]

## 2022-01-25 NOTE — Patient Instructions (Signed)
Medication Instructions:  Your physician recommends that you continue on your current medications as directed. Please refer to the Current Medication list given to you today.   *If you need a refill on your cardiac medications before your next appointment, please call your pharmacy*  Lab Work: NONE  Testing/Procedures: NONE  Follow-Up: At [facility], you and your health needs are our priority.  As part of our continuing mission to provide you with exceptional heart care, we have created designated Provider Care Teams.  These Care Teams include your primary Cardiologist (physician) and Advanced Practice Providers (APPs -  Physician Assistants and Nurse Practitioners) who all work together to provide you with the care you need, when you need it.  We recommend signing up for the patient portal called "MyChart".  Sign up information is provided on this After Visit Summary.  MyChart is used to connect with patients for Virtual Visits (Telemedicine).  Patients are able to view lab/test results, encounter notes, upcoming appointments, etc.  Non-urgent messages can be sent to your provider as well.   To learn more about what you can do with MyChart, go to [email or web address].    Your next appointment:   2 year(s)  The format for your next appointment:   In Person  Provider:   Buford Dresser, MD

## 2022-01-31 ENCOUNTER — Ambulatory Visit (HOSPITAL_BASED_OUTPATIENT_CLINIC_OR_DEPARTMENT_OTHER): Payer: 59 | Admitting: Anesthesiology

## 2022-01-31 ENCOUNTER — Other Ambulatory Visit: Payer: Self-pay

## 2022-01-31 ENCOUNTER — Encounter (HOSPITAL_BASED_OUTPATIENT_CLINIC_OR_DEPARTMENT_OTHER): Payer: Self-pay | Admitting: Orthopedic Surgery

## 2022-01-31 ENCOUNTER — Ambulatory Visit (HOSPITAL_BASED_OUTPATIENT_CLINIC_OR_DEPARTMENT_OTHER)
Admission: RE | Admit: 2022-01-31 | Discharge: 2022-01-31 | Disposition: A | Discharge status: Home / Self Care | Payer: 59 | Attending: Orthopedic Surgery | Admitting: Orthopedic Surgery

## 2022-01-31 ENCOUNTER — Encounter (HOSPITAL_BASED_OUTPATIENT_CLINIC_OR_DEPARTMENT_OTHER)
Admission: RE | Disposition: A | Discharge status: Home / Self Care | Payer: Self-pay | Source: Home / Self Care | Attending: Orthopedic Surgery

## 2022-01-31 DIAGNOSIS — Z01818 Encounter for other preprocedural examination: Secondary | ICD-10-CM

## 2022-01-31 DIAGNOSIS — Q279 Congenital malformation of peripheral vascular system, unspecified: Secondary | ICD-10-CM

## 2022-01-31 DIAGNOSIS — I471 Supraventricular tachycardia: Secondary | ICD-10-CM

## 2022-01-31 DIAGNOSIS — Q278 Other specified congenital malformations of peripheral vascular system: Secondary | ICD-10-CM | POA: Insufficient documentation

## 2022-01-31 DIAGNOSIS — R2232 Localized swelling, mass and lump, left upper limb: Secondary | ICD-10-CM | POA: Diagnosis present

## 2022-01-31 DIAGNOSIS — M1990 Unspecified osteoarthritis, unspecified site: Secondary | ICD-10-CM | POA: Diagnosis not present

## 2022-01-31 HISTORY — PX: MASS EXCISION: SHX2000

## 2022-01-31 SURGERY — EXCISION MASS
Anesthesia: Monitor Anesthesia Care | Site: Thumb | Laterality: Left

## 2022-01-31 MED ORDER — TRAMADOL HCL 50 MG PO TABS: ORAL_TABLET | ORAL | 0 refills | Status: DC

## 2022-01-31 MED ORDER — MIDAZOLAM HCL 5 MG/5ML IJ SOLN
INTRAMUSCULAR | Status: DC | PRN
  Administered 2022-01-31: 2 mg via INTRAVENOUS

## 2022-01-31 MED ORDER — PROPOFOL 500 MG/50ML IV EMUL
INTRAVENOUS | Status: AC
  Filled 2022-01-31: qty 100

## 2022-01-31 MED ORDER — PROPOFOL 500 MG/50ML IV EMUL
INTRAVENOUS | Status: DC | PRN
  Administered 2022-01-31: 200 ug/kg/min via INTRAVENOUS

## 2022-01-31 MED ORDER — FENTANYL CITRATE (PF) 100 MCG/2ML IJ SOLN
INTRAMUSCULAR | Status: DC | PRN
  Administered 2022-01-31 (×2): 25 ug via INTRAVENOUS

## 2022-01-31 MED ORDER — MIDAZOLAM HCL 2 MG/2ML IJ SOLN
INTRAMUSCULAR | Status: AC
  Filled 2022-01-31: qty 2

## 2022-01-31 MED ORDER — BUPIVACAINE HCL (PF) 0.25 % IJ SOLN
INTRAMUSCULAR | Status: DC | PRN
  Administered 2022-01-31: 9 mL

## 2022-01-31 MED ORDER — LACTATED RINGERS IV SOLN: INTRAVENOUS | Status: DC

## 2022-01-31 MED ORDER — KETOROLAC TROMETHAMINE 30 MG/ML IJ SOLN
INTRAMUSCULAR | Status: AC
  Filled 2022-01-31: qty 1

## 2022-01-31 MED ORDER — LIDOCAINE HCL (CARDIAC) PF 100 MG/5ML IV SOSY
PREFILLED_SYRINGE | INTRAVENOUS | Status: DC | PRN
  Administered 2022-01-31: 40 mg via INTRAVENOUS

## 2022-01-31 MED ORDER — VANCOMYCIN HCL IN DEXTROSE 1-5 GM/200ML-% IV SOLN
1000.0000 mg | INTRAVENOUS | Status: AC
  Administered 2022-01-31: 1000 mg via INTRAVENOUS

## 2022-01-31 MED ORDER — VANCOMYCIN HCL IN DEXTROSE 1-5 GM/200ML-% IV SOLN
INTRAVENOUS | Status: AC
  Filled 2022-01-31: qty 200

## 2022-01-31 MED ORDER — ONDANSETRON HCL 4 MG/2ML IJ SOLN
INTRAMUSCULAR | Status: AC
  Filled 2022-01-31: qty 2

## 2022-01-31 MED ORDER — FENTANYL CITRATE (PF) 100 MCG/2ML IJ SOLN: 25.0000 ug | INTRAMUSCULAR | Status: DC | PRN

## 2022-01-31 MED ORDER — KETOROLAC TROMETHAMINE 30 MG/ML IJ SOLN
INTRAMUSCULAR | Status: DC | PRN
  Administered 2022-01-31: 30 mg via INTRAVENOUS

## 2022-01-31 MED ORDER — ONDANSETRON HCL 4 MG/2ML IJ SOLN
INTRAMUSCULAR | Status: DC | PRN
  Administered 2022-01-31: 4 mg via INTRAVENOUS

## 2022-01-31 MED ORDER — ACETAMINOPHEN 500 MG PO TABS: 1000.0000 mg | ORAL_TABLET | Freq: Once | ORAL | Status: DC

## 2022-01-31 MED ORDER — LIDOCAINE 2% (20 MG/ML) 5 ML SYRINGE
INTRAMUSCULAR | Status: AC
  Filled 2022-01-31: qty 5

## 2022-01-31 MED ORDER — FENTANYL CITRATE (PF) 100 MCG/2ML IJ SOLN
INTRAMUSCULAR | Status: AC
  Filled 2022-01-31: qty 2

## 2022-01-31 SURGICAL SUPPLY — 59 items
APL PRP STRL LF DISP 70% ISPRP (MISCELLANEOUS) ×1
APL SKNCLS STERI-STRIP NONHPOA (GAUZE/BANDAGES/DRESSINGS)
BANDAGE GAUZE 1X75IN STRL (MISCELLANEOUS) IMPLANT
BENZOIN TINCTURE PRP APPL 2/3 (GAUZE/BANDAGES/DRESSINGS) IMPLANT
BLADE MINI RND TIP GREEN BEAV (BLADE) IMPLANT
BLADE SURG 15 STRL LF DISP TIS (BLADE) ×2 IMPLANT
BLADE SURG 15 STRL SS (BLADE) ×4
BNDG CMPR 5X2 CHSV 1 LYR STRL (GAUZE/BANDAGES/DRESSINGS)
BNDG CMPR 75X11 PLY HI ABS (MISCELLANEOUS)
BNDG CMPR 75X21 PLY HI ABS (MISCELLANEOUS)
BNDG CMPR 9X4 STRL LF SNTH (GAUZE/BANDAGES/DRESSINGS)
BNDG COHESIVE 1X5 TAN STRL LF (GAUZE/BANDAGES/DRESSINGS) IMPLANT
BNDG COHESIVE 2X5 TAN ST LF (GAUZE/BANDAGES/DRESSINGS) IMPLANT
BNDG ELASTIC 2X5.8 VLCR STR LF (GAUZE/BANDAGES/DRESSINGS) IMPLANT
BNDG ELASTIC 3X5.8 VLCR STR LF (GAUZE/BANDAGES/DRESSINGS) IMPLANT
BNDG ESMARK 4X9 LF (GAUZE/BANDAGES/DRESSINGS) IMPLANT
BNDG GAUZE 1X75IN STRL (MISCELLANEOUS)
BNDG GAUZE DERMACEA FLUFF (GAUZE/BANDAGES/DRESSINGS)
BNDG GAUZE DERMACEA FLUFF 4 (GAUZE/BANDAGES/DRESSINGS) IMPLANT
BNDG GZE DERMACEA 4 6PLY (GAUZE/BANDAGES/DRESSINGS)
BNDG PLASTER X FAST 3X3 WHT LF (CAST SUPPLIES) IMPLANT
BNDG PLSTR 9X3 FST ST WHT (CAST SUPPLIES)
CHLORAPREP W/TINT 26 (MISCELLANEOUS) ×2 IMPLANT
CORD BIPOLAR FORCEPS 12FT (ELECTRODE) ×2 IMPLANT
COVER BACK TABLE 60X90IN (DRAPES) ×2 IMPLANT
COVER MAYO STAND STRL (DRAPES) ×2 IMPLANT
CUFF TOURN SGL QUICK 18X4 (TOURNIQUET CUFF) ×2 IMPLANT
DRAPE EXTREMITY T 121X128X90 (DISPOSABLE) ×2 IMPLANT
DRAPE SURG 17X23 STRL (DRAPES) ×2 IMPLANT
GAUZE SPONGE 4X4 12PLY STRL (GAUZE/BANDAGES/DRESSINGS) ×2 IMPLANT
GAUZE STRETCH 2X75IN STRL (MISCELLANEOUS) IMPLANT
GAUZE XEROFORM 1X8 LF (GAUZE/BANDAGES/DRESSINGS) ×2 IMPLANT
GLOVE BIO SURGEON STRL SZ7.5 (GLOVE) ×2 IMPLANT
GLOVE BIOGEL PI IND STRL 8 (GLOVE) ×1 IMPLANT
GLOVE BIOGEL PI INDICATOR 8 (GLOVE) ×1
GOWN STRL REUS W/ TWL LRG LVL3 (GOWN DISPOSABLE) ×1 IMPLANT
GOWN STRL REUS W/TWL LRG LVL3 (GOWN DISPOSABLE) ×2
GOWN STRL REUS W/TWL XL LVL3 (GOWN DISPOSABLE) ×2 IMPLANT
NDL HYPO 25X1 1.5 SAFETY (NEEDLE) ×1 IMPLANT
NEEDLE HYPO 25X1 1.5 SAFETY (NEEDLE) ×2 IMPLANT
NS IRRIG 1000ML POUR BTL (IV SOLUTION) ×2 IMPLANT
PACK BASIN DAY SURGERY FS (CUSTOM PROCEDURE TRAY) ×2 IMPLANT
PAD CAST 3X4 CTTN HI CHSV (CAST SUPPLIES) IMPLANT
PAD CAST 4YDX4 CTTN HI CHSV (CAST SUPPLIES) IMPLANT
PADDING CAST ABS 4INX4YD NS (CAST SUPPLIES) ×1
PADDING CAST ABS COTTON 4X4 ST (CAST SUPPLIES) ×1 IMPLANT
PADDING CAST COTTON 3X4 STRL (CAST SUPPLIES)
PADDING CAST COTTON 4X4 STRL (CAST SUPPLIES)
STOCKINETTE 4X48 STRL (DRAPES) ×2 IMPLANT
STRIP CLOSURE SKIN 1/2X4 (GAUZE/BANDAGES/DRESSINGS) IMPLANT
SUT ETHILON 3 0 PS 1 (SUTURE) IMPLANT
SUT ETHILON 4 0 PS 2 18 (SUTURE) ×2 IMPLANT
SUT ETHILON 5 0 P 3 18 (SUTURE)
SUT NYLON ETHILON 5-0 P-3 1X18 (SUTURE) IMPLANT
SUT VIC AB 4-0 P2 18 (SUTURE) IMPLANT
SYR BULB EAR ULCER 3OZ GRN STR (SYRINGE) ×2 IMPLANT
SYR CONTROL 10ML LL (SYRINGE) ×2 IMPLANT
TOWEL GREEN STERILE FF (TOWEL DISPOSABLE) ×4 IMPLANT
UNDERPAD 30X36 HEAVY ABSORB (UNDERPADS AND DIAPERS) ×2 IMPLANT

## 2022-01-31 NOTE — H&P (Signed)
Andrew Matthews is an 61 y.o. male.   Chief Complaint: left thumb mass HPI: 61 yo male with mass in left thumb pad.  It is bothersome to him.  It has decreased in size but is not going away.  He wishes to have it removed.  Allergies:  Allergies  Allergen Reactions   Penicillins Other (See Comments)    Makes blood pressure drop    Past Medical History:  Diagnosis Date   Arthritis    osteoarthritis lower back   Dysrhythmia    Arrythmias  with workouts See's Dr. Wynonia Lawman    Past Surgical History:  Procedure Laterality Date   hemmhroiectomy     INGUINAL HERNIA REPAIR Bilateral 06/02/2018   Procedure: LAPAROSCOPIC BILATERAL INGUINAL HERNIA REPAIR;  Surgeon: Kieth Brightly Arta Bruce, MD;  Location: [facility];  Service: General;  Laterality: Bilateral;   INSERTION OF MESH Bilateral 06/02/2018   Procedure: INSERTION OF MESH;  Surgeon: Kieth Brightly Arta Bruce, MD;  Location: [facility];  Service: General;  Laterality: Bilateral;   SHOULDER SURGERY     manipulation under anesthesia   VASECTOMY      Family History: History reviewed. No pertinent family history.  Social History:   reports that he has never smoked. He has never used smokeless tobacco. He reports current alcohol use of about 1.0 standard drink of alcohol per week. He reports that he does not use drugs.  Medications: Medications Prior to Admission  Medication Sig Dispense Refill   diltiazem (CARDIZEM) 30 MG tablet TAKE 1 TAB BY MOUTH EVERY 4 HOURS AS NEEDED (FOR ELEVATED HEART RATE AND 1 HOUR PRIOR TO BIKE RIDE). 20 tablet 11   doxylamine, Sleep, (SLEEP AID) 25 MG tablet Take 25 mg by mouth at bedtime as needed. As needed for sleep     fexofenadine-pseudoephedrine (ALLEGRA-D 24) 180-240 MG 24 hr tablet Take 1 tablet by mouth daily.     ibuprofen (ADVIL,MOTRIN) 800 MG tablet Take 1 tablet (800 mg total) by mouth every 8 (eight) hours as needed. 30 tablet 0   Melatonin 1 MG CAPS Take 1 mg by mouth as needed. 1 tablet at bedtime      meloxicam (MOBIC) 7.5 MG tablet Take 7.5 mg by mouth daily. 1 tablet as need     Multiple Vitamin (MULTIVITAMIN WITH MINERALS) TABS tablet Take 1 tablet by mouth daily.      No results found for this or any previous visit (from the past 48 hour(s)).  No results found.    Blood pressure 134/83, pulse (!) 50, temperature 97.6 F (36.4 C), temperature source Oral, resp. rate 14, height 6' (1.829 m), weight 80.3 kg, SpO2 100 %.  General appearance: alert, cooperative, and appears stated age Head: Normocephalic, without obvious abnormality, atraumatic Neck: supple, symmetrical, trachea midline Extremities: Intact sensation and capillary refill all digits.  +epl/fpl/io.  No wounds. Radial side of left thumb pad with BB size mass with purplish coloration.   Pulses: 2+ and symmetric Skin: Skin color, texture, turgor normal. No rashes or lesions Neurologic: Grossly normal Incision/Wound: none  Assessment/Plan Left thumb mass.  He wishes to have this removed.  Risks, benefits and alternatives of surgery were discussed including risks of blood loss, infection, damage to nerves/vessels/tendons/ligament/bone, failure of surgery, need for additional surgery, complication with wound healing, stiffness, recurrence.  He voiced understanding of these risks and elected to proceed.    Leanora Cover 01/31/2022, 12:57 PM

## 2022-01-31 NOTE — Discharge Instructions (Addendum)

## 2022-01-31 NOTE — Anesthesia Postprocedure Evaluation (Signed)
Anesthesia Post Note  Patient: Andrew Matthews  Procedure(s) Performed: EXCISION MASS LEFT THUMB PULP (Left: Thumb)     Patient location during evaluation: PACU Anesthesia Type: MAC Level of consciousness: awake and alert Pain management: pain level controlled Vital Signs Assessment: post-procedure vital signs reviewed and stable Respiratory status: spontaneous breathing, nonlabored ventilation, respiratory function stable and patient connected to nasal cannula oxygen Cardiovascular status: stable and blood pressure returned to baseline Postop Assessment: no apparent nausea or vomiting Anesthetic complications: no   No notable events documented.  Last Vitals:  Vitals:   01/31/22 1415 01/31/22 1430  BP: (!) 147/71 139/70  Pulse: (!) 47 (!) 42  Resp: 11 12  Temp:    SpO2: 96% 95%    Last Pain:  Vitals:   01/31/22 1430  TempSrc:   PainSc: 0-No pain                 Momin Misko L Braxton Weisbecker

## 2022-01-31 NOTE — Transfer of Care (Signed)
Immediate Anesthesia Transfer of Care Note  Patient: Andrew Matthews  Procedure(s) Performed: EXCISION MASS LEFT THUMB PULP (Left: Thumb)  Patient Location: PACU  Anesthesia Type:MAC  Level of Consciousness: awake  Airway & Oxygen Therapy: Patient Spontanous Breathing  Post-op Assessment: Report given to RN and Post -op Vital signs reviewed and stable  Post vital signs: Reviewed and stable  Last Vitals:  Vitals Value Taken Time  BP 145/75 01/31/22 1400  Temp    Pulse 46 01/31/22 1400  Resp 12 01/31/22 1400  SpO2 100 % 01/31/22 1400  Vitals shown include unvalidated device data.  Last Pain:  Vitals:   01/31/22 1130  TempSrc: Oral  PainSc: 0-No pain         Complications: No notable events documented.

## 2022-01-31 NOTE — Op Note (Addendum)
NAME: Traycen Goyer MEDICAL RECORD NO: [id number] DATE OF BIRTH: October 20, 1960 FACILITY: [facility] LOCATION:  [facility] PHYSICIAN: Tennis Must, MD   OPERATIVE REPORT   DATE OF PROCEDURE: 01/31/22    PREOPERATIVE DIAGNOSIS: Left thumb mass   POSTOPERATIVE DIAGNOSIS: Left thumb vascular malformation   PROCEDURE: Excision mass left thumb, subcutaneous tissues, 5 mm   SURGEON:  Leanora Cover, M.D.   ASSISTANT: none   ANESTHESIA:  Local with sedation   INTRAVENOUS FLUIDS:  Per anesthesia flow sheet.   ESTIMATED BLOOD LOSS:  Minimal.   COMPLICATIONS:  None.   SPECIMENS: Left thumb mass to pathology   TOURNIQUET TIME:    Total Tourniquet Time Documented: area (laterality) - 10 minutes Total: area (laterality) - 10 minutes    DISPOSITION:  Stable to PACU.   INDICATIONS: 61 year old male has noted a mass in the pulp of his left thumb.  It is bothersome to him.  It changes in size but does not go away.  He wishes to have it removed.  Risks, benefits and alternatives of surgery were discussed including the risks of blood loss, infection, damage to nerves, vessels, tendons, ligaments, bone for surgery, need for additional surgery, complications with wound healing, continued pain, stiffness, , recurrence.  He voiced understanding of these risks and elected to proceed.  OPERATIVE COURSE:  After being identified preoperatively by myself,  the patient and I agreed on the procedure and site of the procedure.  The surgical site was marked.  Surgical consent had been signed. Preoperative IV antibiotic prophylaxis was given. He was transferred to the operating room and placed on the operating table in supine position with the Left upper extremity on an arm board.  Sedation was induced by the anesthesiologist.  A surgical pause was performed between the surgeons anesthesia and operating room staff and all were in agreement as to the patient procedure and site procedure.  A digital  block was performed with quarter percent plain Marcaine.  Left upper extremity was prepped and draped in normal sterile orthopedic fashion.  A surgical pause was performed between the surgeons, anesthesia, and operating room staff and all were in agreement as to the patient, procedure, and site of procedure.  Tourniquet at the proximal aspect of the extremity was inflated to 250 mmHg after exsanguination of the arm with an Esmarch bandage.  Incision was made over the palpable mass at the radial side of the pad of the thumb.  This was carried in subcutaneous tissues by spreading technique.  Mass was easily identified.  It was darkish red in coloration.  It appeared to be a vascular malformation.  It was carefully freed up.  It was freed of soft tissue attachments.  It was removed and sent to pathology for examination.  It measured 5 mm in diameter.  The wound was copiously irrigated with sterile saline.  It was examined and no remaining mass was noted.  The area was palpated and no masses palpable.  Skin was closed with 4-0 nylon in a horizontal mattress fashion.  The wound was then dressed with sterile Xeroform 4 x 4 and wrapped with a Coban dressing lightly.  An AlumaFoam splint was placed and wrapped lightly with Coban dressing the tourniquet was deflated at 10 minutes.  Fingertips were pink with brisk capillary refill after deflation of tourniquet.  The operative  drapes were broken down.  The patient was awoken from anesthesia safely.  He was transferred back to the stretcher and taken to PACU  in stable condition.  I will see him back in the office in 1 week for postoperative followup.  I will give him a prescription for Ultram 50 mg 1-2 p.o. every 6 hours as needed pain dispense #10.   Leanora Cover, MD Electronically signed, 01/31/22

## 2022-01-31 NOTE — Anesthesia Preprocedure Evaluation (Addendum)
Anesthesia Evaluation  Patient identified by MRN, date of birth, ID band Patient awake    Reviewed: Allergy & Precautions, NPO status , Patient's Chart, lab work & pertinent test results  Airway Mallampati: II  TM Distance: >3 FB Neck ROM: Full    Dental  (+) Teeth Intact, Dental Advisory Given Upper and lower braces:   Pulmonary neg pulmonary ROS,    Pulmonary exam normal breath sounds clear to auscultation       Cardiovascular Normal cardiovascular exam+ dysrhythmias Supra Ventricular Tachycardia  Rhythm:Regular Rate:Normal  Monitor 10/27/19 14 days of data recorded on Zio monitor. Patient had a min HR of 37 bpm, max HR of 203 bpm, and avg HR of 56 bpm. Predominant underlying rhythm was Sinus Rhythm. No VT, atrial fibrillation, high degree block, or pauses noted. 10 SVT events notes, longest was 6 mins 24 seconds, with maximum rate of 203 bpm. Isolated atrial and ventricular ectopy was rare (<1%). There were 9 triggered events. These were sinus rhythm, many near periods of SVT.   Neuro/Psych negative neurological ROS  negative psych ROS   GI/Hepatic negative GI ROS, Neg liver ROS,   Endo/Other  negative endocrine ROS  Renal/GU negative Renal ROS  negative genitourinary   Musculoskeletal  (+) Arthritis ,   Abdominal   Peds  Hematology negative hematology ROS (+)   Anesthesia Other Findings   Reproductive/Obstetrics                            Anesthesia Physical Anesthesia Plan  ASA: 2  Anesthesia Plan: MAC   Post-op Pain Management: Minimal or no pain anticipated and Tylenol PO (pre-op)*   Induction: Intravenous  PONV Risk Score and Plan: Propofol infusion, Treatment may vary due to age or medical condition, Midazolam, Ondansetron and Dexamethasone  Airway Management Planned: Natural Airway  Additional Equipment:   Intra-op Plan:   Post-operative Plan:   Informed Consent: I have  reviewed the patients History and Physical, chart, labs and discussed the procedure including the risks, benefits and alternatives for the proposed anesthesia with the patient or authorized representative who has indicated his/her understanding and acceptance.     Dental advisory given  Plan Discussed with: CRNA  Anesthesia Plan Comments:         Anesthesia Quick Evaluation

## 2022-02-01 LAB — SURGICAL PATHOLOGY

## 2022-02-04 ENCOUNTER — Encounter (HOSPITAL_BASED_OUTPATIENT_CLINIC_OR_DEPARTMENT_OTHER): Payer: Self-pay | Admitting: Orthopedic Surgery

## 2023-10-21 ENCOUNTER — Encounter: Payer: Self-pay | Admitting: Dermatology

## 2023-10-22 ENCOUNTER — Encounter: Payer: 59 | Admitting: Dermatology

## 2023-10-22 ENCOUNTER — Encounter: Payer: Self-pay | Admitting: Dermatology

## 2023-10-22 ENCOUNTER — Ambulatory Visit (INDEPENDENT_AMBULATORY_CARE_PROVIDER_SITE_OTHER): Payer: 59 | Admitting: Dermatology

## 2023-10-22 VITALS — BP 113/62 | HR 61 | Temp 98.7°F

## 2023-10-22 DIAGNOSIS — C44319 Basal cell carcinoma of skin of other parts of face: Secondary | ICD-10-CM

## 2023-10-22 DIAGNOSIS — L814 Other melanin hyperpigmentation: Secondary | ICD-10-CM

## 2023-10-22 DIAGNOSIS — L579 Skin changes due to chronic exposure to nonionizing radiation, unspecified: Secondary | ICD-10-CM | POA: Diagnosis not present

## 2023-10-22 DIAGNOSIS — C4491 Basal cell carcinoma of skin, unspecified: Secondary | ICD-10-CM

## 2023-10-22 MED ORDER — TRAMADOL HCL 50 MG PO TABS: 50.0000 mg | ORAL_TABLET | Freq: Four times a day (QID) | ORAL | 0 refills | Status: DC | PRN

## 2023-10-22 NOTE — Progress Notes (Signed)
 Follow-Up Visit   Subjective  Andrew Matthews is a 63 y.o. male who presents for the following: Mohs of a nodular Basal Cell Carcinoma on the left central malar cheek, referred by Dr. Zannie Kehr.  The following portions of the chart were reviewed this encounter and updated as appropriate: medications, allergies, medical history  Review of Systems:  No other skin or systemic complaints except as noted in HPI or Assessment and Plan.  Objective  Well appearing patient in no apparent distress; mood and affect are within normal limits.  A focused examination was performed of the following areas: Left central malar cheek Relevant physical exam findings are noted in the Assessment and Plan.   left central malar cheek Pink pearly papule or plaque with arborizing vessels.    Assessment & Plan   BASAL CELL CARCINOMA (BCC), UNSPECIFIED SITE left central malar cheek Mohs surgery  Consent obtained: written  Anticoagulation: Is the patient taking prescription anticoagulant and/or aspirin prescribed/recommended by a physician? No   Was the anticoagulation regimen changed prior to Mohs? No    Procedure Details: Timeout: pre-procedure verification complete Procedure Prep: patient was prepped and draped in usual sterile fashion Prep type: chlorhexidine Biopsy accession number: [id number] Biopsy lab: [facility] Date of biopsy: 09/22/2023 Frozen section biopsy performed: No   Specimen debulked: No   Pre-Op diagnosis: basal cell carcinoma BCC subtype: nodular MohsAIQ Surgical site (if tumor spans multiple areas, please select predominant area): cheek (including jawline) Surgery side: left Surgical site (from skin exam): left central malar cheek Pre-operative length (cm): 0.5 Pre-operative width (cm): 0.6 Indications for Mohs surgery: anatomic location where tissue conservation is critical Previously treated? No    Micrographic Surgery Details: Post-operative length  (cm): 0.8 Post-operative width (cm): 0.9 Number of Mohs stages: 2 Is this a complex case (associate members only): No    Patient tolerance of procedure: tolerated well, no immediate complications  Reconstruction: Was the defect reconstructed? Yes   Was reconstruction performed by the same Mohs surgeon? Yes   Setting of reconstruction: outpatient office When was reconstruction performed? same day Type of reconstruction: linear Linear reconstruction: complex  Opioids: Did the patient receive a prescription for opioid/narcotic related to Mohs surgery? Yes   Indications for opioid/narcotics: patient required additional pain relief despite trial of non-opioid analgesia  Antibiotics: Does patient meet AHA guidelines for endocarditis?: No   Does patient meet AHA guidelines for orthopedic prophylaxis?: No   Were antibiotics given on the day of surgery?: No   Did surgery breach mucosa, expose cartilage/bone, involve an area of lymphedema/inflamed/infected tissue? No    Skin repair Complexity:  Complex Final length (cm):  3.5 Informed consent: discussed and consent obtained   Timeout: patient name, date of birth, surgical site, and procedure verified   Procedure prep:  Patient was prepped and draped in usual sterile fashion Prep type:  Chlorhexidine Anesthesia: the lesion was anesthetized in a standard fashion   Anesthetic:  1% lidocaine w/ epinephrine 1-100,000 buffered w/ 8.4% NaHCO3 Reason for type of repair: reduce tension to allow closure and preserve normal anatomy   Undermining: edges undermined   Subcutaneous layers (deep stitches):  Suture size:  5-0 Suture type: Monocryl (poliglecaprone 25)   Stitches:  Buried vertical mattress Fine/surface layer approximation (top stitches):  Suture size:  6-0 Suture type: fast-absorbing plain gut   Stitches: simple running   Hemostasis achieved with: suture, pressure and electrodesiccation Outcome: patient tolerated procedure well with  no complications   Post-procedure details:  sterile dressing applied and wound care instructions given   Dressing type: petrolatum, bandage and pressure dressing   Related Medications traMADol (ULTRAM) 50 MG tablet Take 1 tablet (50 mg total) by mouth every 6 (six) hours as needed for up to 8 doses.  Return in about 4 weeks (around 11/19/2023).   Documentation: I have reviewed the above documentation for accuracy and completeness, and I agree with the above.   10/22/2023  HISTORY OF PRESENT ILLNESS  Andrew Matthews is seen in consultation at the request of Dr. Zannie Kehr for biopsy-proven Nodular Basal Cell Carcinoma on the left central malar cheek. They note that the area has been present for about 1 year increasing in size with time.  There is no history of previous treatment.  Reports no other new or changing lesions and has no other complaints today.  Medications and allergies: see patient chart.  Review of systems: Reviewed 8 systems and notable for the above skin cancer.  All other systems reviewed are unremarkable/negative, unless noted in the HPI. Past medical history, surgical history, family history, social history were also reviewed and are noted in the chart/questionnaire.    PHYSICAL EXAMINATION  General: Well-appearing, in no acute distress, alert and oriented x 4. Vitals reviewed in chart (if available).   Skin: Exam reveals a 0.5 x 0.6 cm erythematous papule and biopsy scar on the left central malar cheek. There are rhytids, telangiectasias, and lentigines, consistent with photodamage.  Biopsy report(s) reviewed, confirming the diagnosis.   ASSESSMENT  1) Nodular Basal Cell Carcinoma on the left central malar cheek 2) photodamage 3) solar lentigines   PLAN   1. Due to location, size, histology, or recurrence and the likelihood of subclinical extension as well as the need to conserve normal surrounding tissue, the patient was deemed acceptable for Mohs micrographic  surgery (MMS).  The nature and purpose of the procedure, associated benefits and risks including recurrence and scarring, possible complications such as pain, infection, and bleeding, and alternative methods of treatment if appropriate were discussed with the patient during consent. The lesion location was verified by the patient, by reviewing previous notes, pathology reports, and by photographs as well as angulation measurements if available.  Informed consent was reviewed and signed by the patient, and timeout was performed at 10:00 AM. See op note below.  2. For the photodamage and solar lentigines, sun protection discussed/information given on OTC sunscreens, and we recommend continued regular follow-up with primary dermatologist every 6 months or sooner for any growing, bleeding, or changing lesions. 3. Prognosis and future surveillance discussed. 4. Letter with treatment outcome sent to referring provider. 5. Pain acetaminophen/ibuprofen/tramadol 50 mg  MOHS MICROGRAPHIC SURGERY AND RECONSTRUCTION  Initial size:   0.5 x 0.6 cm Surgical defect/wound size: 0.8 x 0.9 cm Anesthesia:    0.33% lidocaine with 1:200,000 epinephrine EBL:    <5 mL Complications:  None Repair type:   Complex SQ suture:   5-0 Monocryl Cutaneous suture:  6-0 Plain gut Final size of the repair: 3.5 cm  Stages: 2  STAGE I: Anesthesia achieved with 0.5% lidocaine with 1:200,000 epinephrine. ChloraPrep applied. 1 section(s) excised using Mohs technique (this includes total peripheral and deep tissue margin excision and evaluation with frozen sections, excised and interpreted by the same physician). The tumor was first debulked and then excised with an approx. 2 mm margin.  Hemostasis was achieved with electrocautery as needed.  The specimen was then oriented, subdivided/relaxed, inked, and processed using Mohs technique.  Frozen section analysis revealed a positive margin for islands of cells with peripheral palisading  and a haphazard arrangement of the more central cells in the deep and peripheral margin.    STAGE II: An additional 2 mm margin was excised.  Hemostasis was achieved with electrocautery as needed.  The specimen was then oriented, subdivided/relaxed, inked, and processed using Mohs technique. Evaluation of slides by the Mohs surgeon revealed clear tumor margins.  Reconstruction  The surgical wound was then cleaned, prepped, and re-anesthetized as above. Wound edges were undermined extensively along at least one entire edge and at a distance equal to or greater than the width of the defect (see wound defect size above) in order to achieve closure and decrease wound tension and anatomic distortion. Redundant tissue repair including standing cone removal was performed. Hemostasis was achieved with electrocautery. Subcutaneous and epidermal tissues were approximated with the above sutures. The surgical site was then lightly scrubbed with sterile, saline-soaked gauze. The area was then bandaged using Vaseline ointment, non-adherent gauze, gauze pads, and tape to provide an adequate pressure dressing. The patient tolerated the procedure well, was given detailed written and verbal wound care instructions, and was discharged in good condition.   The patient will follow-up: 4 weeks.    Gwenith Daily, MD

## 2023-10-22 NOTE — Patient Instructions (Signed)

## 2023-10-30 ENCOUNTER — Encounter: Payer: Self-pay | Admitting: Dermatology

## 2023-11-13 ENCOUNTER — Encounter: Payer: Self-pay | Admitting: Dermatology

## 2023-11-13 ENCOUNTER — Ambulatory Visit: Admitting: Dermatology

## 2023-11-13 VITALS — BP 129/66 | HR 41

## 2023-11-13 DIAGNOSIS — L539 Erythematous condition, unspecified: Secondary | ICD-10-CM

## 2023-11-13 DIAGNOSIS — C4491 Basal cell carcinoma of skin, unspecified: Secondary | ICD-10-CM

## 2023-11-13 DIAGNOSIS — Z85828 Personal history of other malignant neoplasm of skin: Secondary | ICD-10-CM

## 2023-11-13 DIAGNOSIS — T1490XD Injury, unspecified, subsequent encounter: Secondary | ICD-10-CM

## 2023-11-13 NOTE — Progress Notes (Signed)
   Follow Up Visit   Subjective  Andrew Matthews is a 63 y.o. male who presents for the following: follow up from Mohs surgery   The patient presents for follow up from Mohs surgery for a BCC on the left central malar cheek, treated on 10/22/23, repaired with linear repair. The patient has been bandaging the wound as directed. The endorse the following concerns: No questions or concerns at this time.  The following portions of the chart were reviewed this encounter and updated as appropriate: medications, allergies, medical history  Review of Systems:  No other skin or systemic complaints except as noted in HPI or Assessment and Plan.  Objective  Well appearing patient in no apparent distress; mood and affect are within normal limits.  A full examination was performed including scalp, head, and face. All findings within normal limits unless otherwise noted below.  Healing wound with mild erythema  Relevant physical exam findings are noted in the Assessment and Plan.    Assessment & Plan   Healing s/p Mohs for [facility], treated on 10/22/23, repaired with linear closure - Reassured that wound is healing well - No evidence of infection - No swelling, induration, purulence, dehiscence, or tenderness out of proportion to the clinical exam, see photo above - Discussed that scars take up to 12 months to mature from the date of surgery - Recommend SPF 30+ to scar daily to prevent purple color from UV exposure during scar maturation process - Discussed that erythema and raised appearance of scar will fade over the next 4-6 months - OK to start scar massage at 4-6 weeks post-op - Can consider silicone based products for scar healing starting at 6 weeks post-op - Ok to discontinue ointment daily to wound.  HISTORY OF BASAL CELL CARCINOMA OF THE SKIN - No evidence of recurrence today - Recommend regular full body skin exams - Recommend daily broad spectrum sunscreen SPF 30+ to sun-exposed areas,  reapply every 2 hours as needed.  - Call if any new or changing lesions are noted between office visits  Return if symptoms worsen or fail to improve.  I, Manual Meier, Surg Tech III, am acting as scribe for Gwenith Daily, MD.   Documentation: I have reviewed the above documentation for accuracy and completeness, and I agree with the above.  Gwenith Daily, MD

## 2023-11-13 NOTE — Patient Instructions (Signed)

## 2024-01-15 ENCOUNTER — Encounter: Payer: Self-pay | Admitting: Dermatology

## 2024-04-07 ENCOUNTER — Encounter (HOSPITAL_BASED_OUTPATIENT_CLINIC_OR_DEPARTMENT_OTHER): Payer: Self-pay

## 2024-04-08 ENCOUNTER — Encounter (HOSPITAL_BASED_OUTPATIENT_CLINIC_OR_DEPARTMENT_OTHER): Payer: Self-pay | Admitting: Cardiology

## 2024-04-08 ENCOUNTER — Ambulatory Visit (HOSPITAL_BASED_OUTPATIENT_CLINIC_OR_DEPARTMENT_OTHER): Admitting: Cardiology

## 2024-04-08 VITALS — BP 108/82 | HR 40 | Ht 72.0 in | Wt 181.6 lb

## 2024-04-08 DIAGNOSIS — Z8249 Family history of ischemic heart disease and other diseases of the circulatory system: Secondary | ICD-10-CM | POA: Diagnosis not present

## 2024-04-08 DIAGNOSIS — I471 Supraventricular tachycardia, unspecified: Secondary | ICD-10-CM | POA: Diagnosis not present

## 2024-04-08 DIAGNOSIS — R002 Palpitations: Secondary | ICD-10-CM

## 2024-04-08 DIAGNOSIS — Z7189 Other specified counseling: Secondary | ICD-10-CM

## 2024-04-08 MED ORDER — DILTIAZEM HCL 30 MG PO TABS: ORAL_TABLET | ORAL | 11 refills | Status: AC

## 2024-04-08 NOTE — Progress Notes (Signed)
 Cardiology Office Note:  .   Date:  04/08/2024  ID:  Andrew Matthews, DOB 1961-01-22, MRN [id number] PCP: Dayna Motto, DO  [facility] Providers Cardiologist:  Shelda Bruckner, MD {  History of Present Illness: .   Andrew Matthews is a 63 y.o. male with palpitations, pSVT. I met him 09/16/2019.  Pertinent CV history: Monitor 2021: 10 SVT episodes, longest >6 minutes, with max rate 203 bpm. FH: father had MI, had bypass surgery, was overweight, smoker. Issues started in early 25s. Brother similar pattern, had aortic aneurysm. Older sister (18 years older) has a rhythm issue, has had an ablation. Older sister (16 years older) had an MI recently, overweight.   Wife has Fabry's, got PPM-ICD and ablation.  Today: Has episodes about twice/month of SVT, exertional. Runs and bikes extensively. Takes preventative diltiazem  prior to riding/running in a group. Had one episode when his heart rate was higher than usual (~150) despite prior diltiazem , took a second and felt better. If he's not in a group, he will just slow down/stop if HR goes up.  Has decreased alcohol intake somewhat. Caffeine doesn't seem to worsen symptoms.  Can feel palpitations for short periods of time when he isn't exerting himself, but these are infrequent and mild.   ROS: Denies chest pain, shortness of breath at rest or with normal exertion. No PND, orthopnea, LE edema or unexpected weight gain. No syncope. ROS otherwise negative except as noted.   Studies Reviewed: SABRA    EKG:  EKG Interpretation Date/Time:  Thursday April 08 2024 16:22:26 EDT Ventricular Rate:  40 PR Interval:  144 QRS Duration:  92 QT Interval:  476 QTC Calculation: 387 R Axis:   47  Text Interpretation: Marked sinus bradycardia T wave abnormality, consider anterior ischemia When compared with ECG of 20-May-2018 08:34, No significant change was found Confirmed by Bruckner Shelda [phone]) on 04/08/2024 5:10:27 PM    Physical Exam:    VS:  BP 108/82   Pulse (!) 40   Ht 6' (1.829 m)   Wt 181 lb 9.6 oz (82.4 kg)   SpO2 99%   BMI 24.63 kg/m    Wt Readings from Last 3 Encounters:  04/08/24 181 lb 9.6 oz (82.4 kg)  01/31/22 177 lb 0.5 oz (80.3 kg)  01/25/22 177 lb 3.2 oz (80.4 kg)    GEN: Well nourished, well developed in no acute distress HEENT: Normal, moist mucous membranes NECK: No JVD CARDIAC: regular rhythm, normal S1 and S2, no rubs or gallops. No murmur. VASCULAR: Radial and DP pulses 2+ bilaterally. No carotid bruits RESPIRATORY:  Clear to auscultation without rales, wheezing or rhonchi  ABDOMEN: Soft, non-tender, non-distended MUSCULOSKELETAL:  Ambulates independently SKIN: Warm and dry, no edema NEUROLOGIC:  Alert and oriented x 3. No focal neuro deficits noted. PSYCHIATRIC:  Normal affect    ASSESSMENT AND PLAN: .    Exertional palpitations, paroxysmal SVT:  -Monitor shows paroxysmal SVT, longest 6 minutes -Doing well with pre-ride diltiazem  on long group rides -we have discussed vagal maneuvers for prolonged episodes -counseled on red flag signs that need immediate medical attention   Family history of heart disease Cardiac risk counseling and prevention recommendations: -recommend heart healthy/Mediterranean diet, with whole grains, fruits, vegetable, fish, lean meats, nuts, and olive oil. Limit salt. -recommend moderate walking, 3-5 times/week for 30-50 minutes each session. Aim for at least 150 minutes/week. Goal should be pace of 3 miles/hours, or walking 1.5 miles in 30 minutes. He far exceeds this with his current  exercise regimen. -recommend avoidance of tobacco products. Avoid excess alcohol.  Dispo: 2 years or sooner as needed  Signed, Shelda Bruckner, MD   Shelda Bruckner, MD, PhD, [facility]  [facility]  [place]  [facility] at [facility] [address], [address] [place], [place] [address] [phone]

## 2024-04-08 NOTE — Patient Instructions (Signed)
 Medication Instructions:  Your physician recommends that you continue on your current medications as directed. Please refer to the Current Medication list given to you today.  *If you need a refill on your cardiac medications before your next appointment, please call your pharmacy*  Lab Work: NONE  Testing/Procedures: NONE  Follow-Up: At [facility], you and your health needs are our priority.  As part of our continuing mission to provide you with exceptional heart care, we have created designated Provider Care Teams.  These Care Teams include your primary Cardiologist (physician) and Advanced Practice Providers (APPs -  Physician Assistants and Nurse Practitioners) who all work together to provide you with the care you need, when you need it.  We recommend signing up for the patient portal called MyChart.  Sign up information is provided on this After Visit Summary.  MyChart is used to connect with patients for Virtual Visits (Telemedicine).  Patients are able to view lab/test results, encounter notes, upcoming appointments, etc.  Non-urgent messages can be sent to your provider as well.   To learn more about what you can do with MyChart, go to [email or web address].    Your next appointment:   24 month(s)  The format for your next appointment:   In Person  Provider:   Shelda Bruckner, MD
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
## 2024-06-30 ENCOUNTER — Other Ambulatory Visit: Payer: Self-pay

## 2024-06-30 ENCOUNTER — Ambulatory Visit: Attending: Family Medicine | Admitting: Physical Therapy

## 2024-06-30 ENCOUNTER — Encounter: Payer: Self-pay | Admitting: Physical Therapy

## 2024-06-30 DIAGNOSIS — H8111 Benign paroxysmal vertigo, right ear: Secondary | ICD-10-CM | POA: Insufficient documentation

## 2024-07-07 ENCOUNTER — Ambulatory Visit

## 2024-07-07 DIAGNOSIS — H8111 Benign paroxysmal vertigo, right ear: Secondary | ICD-10-CM

## 2024-07-07 NOTE — Therapy (Signed)
 OUTPATIENT PHYSICAL THERAPY VESTIBULAR TREATMENT     Patient Name: Andrew Matthews MRN: [id number] DOB:1961-03-05, 63 y.o., male Today's Date: 07/07/2024  END OF SESSION:  PT End of Session - 07/07/24 1101     Visit Number 2    Number of Visits 5    Date for Recertification  07/28/24    Authorization Type [employer]    PT Start Time 1101    PT Stop Time 1118    PT Time Calculation (min) 17 min    Activity Tolerance Patient tolerated treatment well    Behavior During Therapy WFL for tasks assessed/performed          Past Medical History:  Diagnosis Date   Arthritis    osteoarthritis lower back   Dysrhythmia    Arrythmias  with workouts See's Dr. Blanca   Past Surgical History:  Procedure Laterality Date   hemmhroiectomy     INGUINAL HERNIA REPAIR Bilateral 06/02/2018   Procedure: LAPAROSCOPIC BILATERAL INGUINAL HERNIA REPAIR;  Surgeon: Stevie Herlene Righter, MD;  Location: [facility];  Service: General;  Laterality: Bilateral;   INSERTION OF MESH Bilateral 06/02/2018   Procedure: INSERTION OF MESH;  Surgeon: Stevie Herlene Righter, MD;  Location: [facility];  Service: General;  Laterality: Bilateral;   MASS EXCISION Left 01/31/2022   Procedure: EXCISION MASS LEFT THUMB PULP;  Surgeon: Murrell Drivers, MD;  Location: [facility];  Service: Orthopedics;  Laterality: Left;  IV REGIONAL FOREARM BLOCK   SHOULDER SURGERY     manipulation under anesthesia   VASECTOMY     There are no active problems to display for this patient.   PCP: Dayna Motto, DO  REFERRING PROVIDER: Dayna Motto, DO   REFERRING DIAG: [phone] (ICD-10-CM) - Other peripheral vertigo, unspecified ear  THERAPY DIAG:  BPPV (benign paroxysmal positional vertigo), right  ONSET DATE: September 2025  Rationale for Evaluation and Treatment: Rehabilitation  SUBJECTIVE:   SUBJECTIVE STATEMENT: No episodes since last session.  Have had a few instances of feeling like it would start but has not.    Pt  accompanied by: self  PERTINENT HISTORY: SVT  PAIN:  Are you having pain? No  PRECAUTIONS: None  RED FLAGS: None   WEIGHT BEARING RESTRICTIONS: No  FALLS: Has patient fallen in last 6 months? No  LIVING ENVIRONMENT: Lives with: lives with their spouse Lives in: House/apartment   PLOF: Independent, Vocation/Vocational requirements: almost retired, and Leisure: biking, running, yoga  PATIENT GOALS: improve dizziness   OBJECTIVE:   TODAY'S TREATMENT: 07/07/24 Activity Comments  Positional tests Roll test-no dizziness/nystagmus  Left Dix-Hallpike- no dizziness/nystagmus Right Dix-Hallpike- no dizziness/nystagmus                   Note: Objective measures were completed at Evaluation unless otherwise noted.  DIAGNOSTIC FINDINGS: none recent  COGNITION: Overall cognitive status: Within functional limits for tasks assessed   SENSATION: Pt reports B medial fingers up to elbow N/T  POSTURE:  rounded shoulders  GAIT: Gait pattern: WFL Assistive device utilized: None Level of assistance: Complete Independence   PATIENT SURVEYS:  DHI: 18/100  VESTIBULAR ASSESSMENT   GENERAL OBSERVATION: pt wears progressive lenses    OCULOMOTOR EXAM: Ocular Alignment: Cover/Uncover Test: WNL Cover/Cross Cover Test: WNL   Ocular ROM: No Limitations   Spontaneous Nystagmus: absent   Gaze-Induced Nystagmus: absent   Smooth Pursuits: intact   Saccades: intact   Convergence/Divergence: 0 cm    VESTIBULAR - OCULAR REFLEX:    Slow VOR: Normal  VOR Cancellation: Normal   Head-Impulse Test: HIT Right: negative HIT Left: negative  AUDITORY SCREEN:  Rinne Test WNL B    POSITIONAL TESTING:  *c/o lag upon sit>supine    Right Roll Test: negative; c/o lag rolling back to supine  Left Roll Test: negative   *c/o lag upon sitting up    Left Loaded Dix-Hallpike: small amplitude L upbeating nystagmus persisting; pt asymptomatic  Right Loaded Dix-Hallpike: larger  amplitude R upbeating torsional nystagmus lasting 15 sec                                                                                                                               TREATMENT DATE: 06/30/24   Canalith Repositioning:  Epley Right: Number of Reps: 1 and Comment: tolerated well   R DH retest: negative    PATIENT EDUCATION: Education details: prognosis, POC, edu on post-CRM expectations, edu on BPPV risk factors and possible triggers Person educated: Patient Education method: Explanation Education comprehension: verbalized understanding  HOME EXERCISE PROGRAM: Not yet initiated   GOALS: Goals reviewed with patient? Yes  SHORT TERM GOALS: Target date: 07/14/2024  Patient to be independent with initial HEP. Baseline: HEP initiated Goal status: INITIAL    LONG TERM GOALS: Target date: 07/28/2024  Patient to be independent with advanced HEP. Baseline: Not yet initiated  Goal status: INITIAL  Patient will report 0/10 dizziness with bed mobility.  Baseline: Symptomatic; 0/10 Goal status: MET  Patient to return to symptom-free yoga. Baseline: symptomatic; no symptoms Goal status: MET  Patient to score at least 18 points less on DHI in order to meet MCID and improve functional outcomes.  Baseline: 18 Goal status: INITIAL   ASSESSMENT:  CLINICAL IMPRESSION:  Returns to clinic and reports resolution of symptoms.  Instructed in positional testing for self-assessment of BPPV with good return demonstration after initial instruction and verbalized understanding.  Pt reports 0/10 symptoms at present and has resumed normal activities.  Will call prn if needed   OBJECTIVE IMPAIRMENTS: decreased activity tolerance and dizziness.   ACTIVITY LIMITATIONS: carrying, lifting, bending, sleeping, and bed mobility  PARTICIPATION LIMITATIONS: laundry and yard work  PERSONAL FACTORS: Age, Fitness, Past/current experiences, and 1 comorbidity: SVT are also affecting  patient's functional outcome.   REHAB POTENTIAL: Good  CLINICAL DECISION MAKING: Evolving/moderate complexity  EVALUATION COMPLEXITY: Moderate   PLAN:  PT FREQUENCY: 1x/week  PT DURATION: 4 weeks  PLANNED INTERVENTIONS: [id number]- PT Re-evaluation, 97110-Therapeutic exercises, 97530- Therapeutic activity, 97112- Neuromuscular re-education, 97535- Self Care, [id number]- Manual therapy, [id number]- Gait training, [phone]- Canalith repositioning, Patient/Family education, Balance training, Stair training, Taping, Vestibular training, Cryotherapy, and Moist heat  PLAN FOR NEXT SESSION: recheck R DH and treat with habituation if symptoms remain  11:17 AM, 07/07/24 M. Kelly Demecia Northway, PT, DPT Physical Therapist- [place] Office Number: [phone]
